# Patient Record
Sex: Female | Born: 1965 | Race: White | Hispanic: No | Marital: Single | Smoking: Former smoker
Health system: Southern US, Community
[De-identification: ages and names within clinical notes are randomized; demographics above are authoritative.]

## PROBLEM LIST (undated history)

## (undated) DIAGNOSIS — H9190 Unspecified hearing loss, unspecified ear: Secondary | ICD-10-CM

## (undated) DIAGNOSIS — F419 Anxiety disorder, unspecified: Secondary | ICD-10-CM

## (undated) DIAGNOSIS — R002 Palpitations: Secondary | ICD-10-CM

## (undated) DIAGNOSIS — L8991 Pressure ulcer of unspecified site, stage 1: Secondary | ICD-10-CM

## (undated) DIAGNOSIS — E875 Hyperkalemia: Secondary | ICD-10-CM

## (undated) DIAGNOSIS — Z973 Presence of spectacles and contact lenses: Secondary | ICD-10-CM

## (undated) DIAGNOSIS — G4700 Insomnia, unspecified: Secondary | ICD-10-CM

## (undated) DIAGNOSIS — R112 Nausea with vomiting, unspecified: Secondary | ICD-10-CM

## (undated) DIAGNOSIS — S82899A Other fracture of unspecified lower leg, initial encounter for closed fracture: Secondary | ICD-10-CM

## (undated) DIAGNOSIS — Z9889 Other specified postprocedural states: Secondary | ICD-10-CM

## (undated) DIAGNOSIS — R911 Solitary pulmonary nodule: Secondary | ICD-10-CM

## (undated) DIAGNOSIS — S62109A Fracture of unspecified carpal bone, unspecified wrist, initial encounter for closed fracture: Secondary | ICD-10-CM

## (undated) DIAGNOSIS — I700 Atherosclerosis of aorta: Secondary | ICD-10-CM

## (undated) DIAGNOSIS — K219 Gastro-esophageal reflux disease without esophagitis: Secondary | ICD-10-CM

## (undated) DIAGNOSIS — I10 Essential (primary) hypertension: Secondary | ICD-10-CM

---

## 1993-08-01 HISTORY — PX: MASTOIDECTOMY: SHX711

## 2002-09-29 ENCOUNTER — Encounter: Payer: Self-pay | Admitting: Emergency Medicine

## 2002-09-29 ENCOUNTER — Emergency Department (HOSPITAL_COMMUNITY): Admission: EM | Admit: 2002-09-29 | Discharge: 2002-09-29 | Payer: Self-pay | Admitting: Emergency Medicine

## 2002-11-11 ENCOUNTER — Ambulatory Visit (HOSPITAL_BASED_OUTPATIENT_CLINIC_OR_DEPARTMENT_OTHER): Admission: RE | Admit: 2002-11-11 | Discharge: 2002-11-11 | Payer: Self-pay | Admitting: Orthopedic Surgery

## 2003-08-02 HISTORY — PX: KNEE ARTHROSCOPY: SHX127

## 2006-01-23 ENCOUNTER — Ambulatory Visit (HOSPITAL_COMMUNITY): Admission: RE | Admit: 2006-01-23 | Discharge: 2006-01-23 | Payer: Self-pay | Admitting: Obstetrics and Gynecology

## 2009-01-15 ENCOUNTER — Emergency Department (HOSPITAL_COMMUNITY): Admission: EM | Admit: 2009-01-15 | Discharge: 2009-01-15 | Payer: Self-pay | Admitting: Emergency Medicine

## 2010-03-28 ENCOUNTER — Emergency Department (HOSPITAL_COMMUNITY): Admission: EM | Admit: 2010-03-28 | Discharge: 2010-03-28 | Payer: Self-pay | Admitting: Emergency Medicine

## 2010-08-22 ENCOUNTER — Encounter: Payer: Self-pay | Admitting: Obstetrics and Gynecology

## 2010-11-09 ENCOUNTER — Emergency Department (HOSPITAL_COMMUNITY)
Admission: EM | Admit: 2010-11-09 | Discharge: 2010-11-09 | Disposition: A | Discharge status: Home / Self Care | Payer: Self-pay | Attending: Emergency Medicine | Admitting: Emergency Medicine

## 2010-11-09 DIAGNOSIS — N390 Urinary tract infection, site not specified: Secondary | ICD-10-CM | POA: Insufficient documentation

## 2010-11-09 DIAGNOSIS — I10 Essential (primary) hypertension: Secondary | ICD-10-CM | POA: Insufficient documentation

## 2010-11-09 DIAGNOSIS — K219 Gastro-esophageal reflux disease without esophagitis: Secondary | ICD-10-CM | POA: Insufficient documentation

## 2010-11-09 LAB — URINALYSIS, ROUTINE W REFLEX MICROSCOPIC
Nitrite: POSITIVE — AB
Protein, ur: 100 mg/dL — AB
Specific Gravity, Urine: 1.019 (ref 1.005–1.030)
Urobilinogen, UA: 2 mg/dL — ABNORMAL HIGH (ref 0.0–1.0)

## 2010-11-09 LAB — URINE MICROSCOPIC-ADD ON

## 2010-11-09 LAB — PREGNANCY, URINE: Preg Test, Ur: NEGATIVE

## 2010-11-11 LAB — URINE CULTURE: Culture  Setup Time: 201204110105

## 2010-11-29 ENCOUNTER — Inpatient Hospital Stay (INDEPENDENT_AMBULATORY_CARE_PROVIDER_SITE_OTHER)
Admission: RE | Admit: 2010-11-29 | Discharge: 2010-11-29 | Disposition: A | Discharge status: Home / Self Care | Payer: Self-pay | Source: Ambulatory Visit | Attending: Family Medicine | Admitting: Family Medicine

## 2010-11-29 DIAGNOSIS — N390 Urinary tract infection, site not specified: Secondary | ICD-10-CM

## 2010-11-29 LAB — POCT URINALYSIS DIP (DEVICE)
Bilirubin Urine: NEGATIVE
Hgb urine dipstick: NEGATIVE
Nitrite: POSITIVE — AB
Protein, ur: NEGATIVE mg/dL
pH: 5 (ref 5.0–8.0)

## 2010-11-29 LAB — GLUCOSE, CAPILLARY

## 2010-11-30 LAB — URINE CULTURE
Colony Count: 25000
Culture  Setup Time: 201204301137

## 2010-12-17 NOTE — Op Note (Signed)
[name], [name]                       ACCOUNT NO.:  [id number]   MEDICAL RECORD NO.:  [id number]                   PATIENT TYPE:  AMB   LOCATION:  DSC                                  FACILITY:  [facility]   PHYSICIAN:  Feliberto Gottron. Turner Daniels, M.D.                DATE OF BIRTH:  04/10/1966   DATE OF PROCEDURE:  11/11/2002  DATE OF DISCHARGE:                                 OPERATIVE REPORT   PREOPERATIVE DIAGNOSIS:  Possible left knee medial meniscal tear with a  locked left knee versus anterior cruciate ligament tear versus chondral  lesion.   POSTOPERATIVE DIAGNOSIS:  Left knee two chondral loose bodies, one tied up  in the ligamentum mucosum, the other in the prepatellar fat pad, donor site  from the lateral facet of the patella.   PROCEDURE:  Arthroscopic removal of loose bodies and debridement of  chondromalacia of patella, grade 3.   SURGEON:  Feliberto Gottron. Turner Daniels, M.D.   FIRST ASSISTANT:  Erskine Squibb B. Jannet Mantis.   ANESTHETIC:  General LMA.   ESTIMATED BLOOD LOSS:  Minimal.   FLUID REPLACEMENT:  800 mL of crystalloid.   DRAINS PLACED:  None.   TOURNIQUET TIME:  None.   INDICATION FOR PROCEDURE:  [age]-year-old woman who sustained an injury  to her left knee at work many weeks ago.  She has presented to our office  with a locked knee.  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  In any event, because of the locked  knee, she presented to the emergency room, was placed in an immobilizer and  at the time we evaluated her on the 9th of March 2004, we could not get her  knee into full extension.  The plan was to proceed with arthroscopic  evaluation and treatment of the knee and because she is 36 and relatively  sedentary, if the ACL was torn, to not repair the ACL.   INDICATION FOR SURGERY:  Pain and loss of function.   DESCRIPTION OF PROCEDURE:  The patient was identified by arm band and taken  to the operating room at  [facility], appropriate anesthetic  monitors were attached and general LMA induced with the patient in the  supine position.  Lateral post was applied to the table and the left lower  extremity was prepped and draped in the usual sterile fashion from the ankle  to the mid-thigh.  Using an #11 blade, standard inferomedial and  inferolateral parapatellar portals were made, allowing introduction of the  arthroscope in through the inferolateral portal and the outflow through the  inferomedial portal.  We immediately identified some -- what appeared to be  -- old blood in the joint fluid and this of course was cleansed with the  arthroscope and the outflow lavage.  We identified chondromalacia of the  lateral facet of the patella  that was about 1 x 1 cm, grade 3, with some  flap tearing and this was debrided back to stable margins.  Moving into the  medial compartment, we identified that the meniscus was intact, as was the  articular cartilage of the trochlea and the medial and the lateral  compartments as well, as well as the lateral meniscus.  We did find some  chunks of cartilage, one tied up in the ligamentum mucosum which had to be  removed with an arthroscopic grasper as it was too big for the sucker  shaver, and another in the prepatellar fat pad, also requiring removal with  the arthroscopic graspers.  More likely than not, the donor site for these  chucks of cartilage, which were about 5 x 5 mm, was the lateral facet of the  patella.  The one in the ligamentum mucosum was in a position where it may  in fact have blocked motion or at least caused pain with extension.  The  gutters were cleared.  The scope was taken medial and lateral to the  posterior compartments as well and the knee was washed out with normal  saline solution.  The underwater electrocautery was used to control some  small bleeders and the arthroscopic instruments were removed.  We then  injected the 0.5%  Marcaine and epinephrine solution x10 mL into the portals  and a dressing of Xeroform, 4 x 4 dressing sponges, Webril and an ACE wrap  applied.  The patient was awakened and taken to the recovery room without  difficulty.                                               Feliberto Gottron. Turner Daniels, M.D.    Ovid Curd  D:  11/11/2002  T:  11/11/2002  Job:  [id number]

## 2011-01-19 ENCOUNTER — Emergency Department (HOSPITAL_COMMUNITY)
Admission: EM | Admit: 2011-01-19 | Discharge: 2011-01-19 | Disposition: A | Discharge status: Home / Self Care | Payer: Self-pay | Attending: Emergency Medicine | Admitting: Emergency Medicine

## 2011-01-19 DIAGNOSIS — R350 Frequency of micturition: Secondary | ICD-10-CM | POA: Insufficient documentation

## 2011-01-19 DIAGNOSIS — K219 Gastro-esophageal reflux disease without esophagitis: Secondary | ICD-10-CM | POA: Insufficient documentation

## 2011-01-19 DIAGNOSIS — R110 Nausea: Secondary | ICD-10-CM | POA: Insufficient documentation

## 2011-01-19 DIAGNOSIS — R3915 Urgency of urination: Secondary | ICD-10-CM | POA: Insufficient documentation

## 2011-01-19 DIAGNOSIS — R300 Dysuria: Secondary | ICD-10-CM | POA: Insufficient documentation

## 2011-01-19 DIAGNOSIS — I10 Essential (primary) hypertension: Secondary | ICD-10-CM | POA: Insufficient documentation

## 2011-01-19 DIAGNOSIS — N390 Urinary tract infection, site not specified: Secondary | ICD-10-CM | POA: Insufficient documentation

## 2011-01-19 DIAGNOSIS — Z79899 Other long term (current) drug therapy: Secondary | ICD-10-CM | POA: Insufficient documentation

## 2011-01-19 LAB — URINALYSIS, ROUTINE W REFLEX MICROSCOPIC
Glucose, UA: NEGATIVE mg/dL
Ketones, ur: 15 mg/dL — AB
Protein, ur: >300 mg/dL — AB

## 2011-01-19 LAB — PREGNANCY, URINE: Preg Test, Ur: NEGATIVE

## 2011-01-19 LAB — URINE MICROSCOPIC-ADD ON

## 2011-01-20 LAB — URINE CULTURE

## 2011-02-08 ENCOUNTER — Inpatient Hospital Stay (INDEPENDENT_AMBULATORY_CARE_PROVIDER_SITE_OTHER)
Admission: RE | Admit: 2011-02-08 | Discharge: 2011-02-08 | Disposition: A | Discharge status: Home / Self Care | Payer: Self-pay | Source: Ambulatory Visit | Attending: Emergency Medicine | Admitting: Emergency Medicine

## 2011-02-08 DIAGNOSIS — N390 Urinary tract infection, site not specified: Secondary | ICD-10-CM

## 2011-02-08 LAB — POCT PREGNANCY, URINE: Preg Test, Ur: NEGATIVE

## 2011-02-08 LAB — POCT URINALYSIS DIP (DEVICE)
Nitrite: POSITIVE — AB
pH: 5 (ref 5.0–8.0)

## 2011-02-08 LAB — URINALYSIS, MICROSCOPIC ONLY
Nitrite: POSITIVE — AB
Specific Gravity, Urine: 1.012 (ref 1.005–1.030)
Urobilinogen, UA: 1 mg/dL (ref 0.0–1.0)

## 2011-02-09 LAB — GLUCOSE, CAPILLARY

## 2011-02-09 LAB — URINE CULTURE: Culture  Setup Time: 201207102146

## 2013-04-08 ENCOUNTER — Other Ambulatory Visit (HOSPITAL_COMMUNITY): Payer: Self-pay | Admitting: Nurse Practitioner

## 2013-04-08 DIAGNOSIS — Z139 Encounter for screening, unspecified: Secondary | ICD-10-CM

## 2013-04-15 ENCOUNTER — Ambulatory Visit (HOSPITAL_COMMUNITY): Payer: Self-pay

## 2013-06-23 ENCOUNTER — Emergency Department (HOSPITAL_COMMUNITY)
Admission: EM | Admit: 2013-06-23 | Discharge: 2013-06-23 | Disposition: A | Discharge status: Home / Self Care | Payer: Self-pay | Attending: Emergency Medicine | Admitting: Emergency Medicine

## 2013-06-23 ENCOUNTER — Encounter (HOSPITAL_COMMUNITY): Payer: Self-pay | Admitting: Emergency Medicine

## 2013-06-23 DIAGNOSIS — J029 Acute pharyngitis, unspecified: Secondary | ICD-10-CM | POA: Insufficient documentation

## 2013-06-23 DIAGNOSIS — I10 Essential (primary) hypertension: Secondary | ICD-10-CM | POA: Insufficient documentation

## 2013-06-23 DIAGNOSIS — J209 Acute bronchitis, unspecified: Secondary | ICD-10-CM | POA: Insufficient documentation

## 2013-06-23 DIAGNOSIS — J40 Bronchitis, not specified as acute or chronic: Secondary | ICD-10-CM

## 2013-06-23 HISTORY — DX: Essential (primary) hypertension: I10

## 2013-06-23 MED ORDER — AMOXICILLIN 250 MG PO CAPS: 250.0000 mg | ORAL_CAPSULE | Freq: Three times a day (TID) | ORAL | Status: DC

## 2013-06-23 NOTE — ED Notes (Signed)
Pt c/o sore throat and cold sx. Pt son recently dx with strep throat.

## 2013-06-23 NOTE — ED Provider Notes (Signed)
Medical screening examination/treatment/procedure(s) were performed by non-physician practitioner and as supervising physician I was immediately available for consultation/collaboration.  EKG Interpretation   None        Donnetta Hutching, MD 06/23/13 1223

## 2013-06-23 NOTE — ED Provider Notes (Signed)
CSN: [id number]     Arrival date & time 06/23/13  1130 History   First MD Initiated Contact with Patient 06/23/13 1155     Chief Complaint  Patient presents with  . Sore Throat   (Consider location/radiation/quality/duration/timing/severity/associated sxs/prior Treatment) Patient is a 47 y.o. female presenting with pharyngitis. The history is provided by the patient.  Sore Throat This is a new problem. The current episode started in the past 7 days. The problem has been gradually worsening. Associated symptoms include chills, coughing, a fever, a sore throat and swollen glands. Pertinent negatives include no abdominal pain, nausea, neck pain, rash or vomiting. The symptoms are aggravated by eating. Treatments tried: OTC medications. The treatment provided mild relief.   [name] is a 47 y.o. female who presents to the ED with a sore throat that started 3 days ago. Her son was diagnosed with strep throat recently. She also has a cough and nasal congestion.  Past Medical History  Diagnosis Date  . Hypertension    History reviewed. No pertinent past surgical history. No family history on file. History  Substance Use Topics  . Smoking status: Never Smoker   . Smokeless tobacco: Not on file  . Alcohol Use: No   OB History   Grav Para Term Preterm Abortions TAB SAB Ect Mult Living                 Review of Systems  Constitutional: Positive for fever and chills.  HENT: Positive for sinus pressure and sore throat. Negative for ear pain, trouble swallowing and voice change.   Eyes: Negative for visual disturbance.  Respiratory: Positive for cough. Negative for shortness of breath.   Gastrointestinal: Negative for nausea, vomiting and abdominal pain.  Musculoskeletal: Negative for neck pain and neck stiffness.  Skin: Negative for rash.  Neurological: Negative for dizziness.  Psychiatric/Behavioral: The patient is not nervous/anxious.     Allergies  Ciprofloxacin; Nitrofuran  derivatives; and Sulfa antibiotics  Home Medications  No current outpatient prescriptions on file. BP 125/51  Pulse 73  Temp(Src) 98.7 F (37.1 C) (Oral)  Resp 18  Ht 5\' 3"  (1.6 m)  Wt 240 lb (108.863 kg)  BMI 42.52 kg/m2  SpO2 100% Physical Exam  Nursing note and vitals reviewed. Constitutional: She is oriented to person, place, and time. She appears well-developed and well-nourished. No distress.  HENT:  Head: Normocephalic and atraumatic.  Right Ear: Tympanic membrane normal.  Left Ear: Tympanic membrane normal.  Mouth/Throat: Uvula is midline and mucous membranes are normal. Posterior oropharyngeal erythema present.  Tonsils enlarged  Eyes: EOM are normal.  Neck: Normal range of motion. Neck supple.  Cardiovascular: Normal rate, regular rhythm and normal heart sounds.   Pulmonary/Chest: Effort normal and breath sounds normal.  Musculoskeletal: Normal range of motion.  Lymphadenopathy:    She has cervical adenopathy.  Neurological: She is alert and oriented to person, place, and time. No cranial nerve deficit.  Skin: Skin is warm and dry.  Psychiatric: She has a normal mood and affect. Her behavior is normal.    ED Course  Procedures  EKG Interpretation   None      MDM  48 y.o. female with sore throat, fever off and on for 3 days and cough. Since her son was diagnoses with with strep throat will treat with antibiotics. She will continue to take her cough medication as needed and tylenol or ibuprofen as needed. She will return if symptoms worsen.  Discussed with the patient  and all questioned fully answered.    Medication List         amoxicillin 250 MG capsule  Commonly known as:  AMOXIL  Take 1 capsule (250 mg total) by mouth 3 (three) times daily.         Janne Napoleon, [place] 06/23/13 1209

## 2013-09-02 ENCOUNTER — Encounter (HOSPITAL_COMMUNITY): Payer: Self-pay | Admitting: Emergency Medicine

## 2013-09-02 ENCOUNTER — Emergency Department (HOSPITAL_COMMUNITY)
Admission: EM | Admit: 2013-09-02 | Discharge: 2013-09-02 | Disposition: A | Discharge status: Home / Self Care | Payer: Self-pay | Attending: Emergency Medicine | Admitting: Emergency Medicine

## 2013-09-02 DIAGNOSIS — J3489 Other specified disorders of nose and nasal sinuses: Secondary | ICD-10-CM | POA: Insufficient documentation

## 2013-09-02 DIAGNOSIS — H9210 Otorrhea, unspecified ear: Secondary | ICD-10-CM | POA: Insufficient documentation

## 2013-09-02 DIAGNOSIS — H9209 Otalgia, unspecified ear: Secondary | ICD-10-CM | POA: Insufficient documentation

## 2013-09-02 DIAGNOSIS — R059 Cough, unspecified: Secondary | ICD-10-CM | POA: Insufficient documentation

## 2013-09-02 DIAGNOSIS — R509 Fever, unspecified: Secondary | ICD-10-CM | POA: Insufficient documentation

## 2013-09-02 DIAGNOSIS — Z87891 Personal history of nicotine dependence: Secondary | ICD-10-CM | POA: Insufficient documentation

## 2013-09-02 DIAGNOSIS — H9201 Otalgia, right ear: Secondary | ICD-10-CM

## 2013-09-02 DIAGNOSIS — I10 Essential (primary) hypertension: Secondary | ICD-10-CM | POA: Insufficient documentation

## 2013-09-02 DIAGNOSIS — Z880 Allergy status to penicillin: Secondary | ICD-10-CM | POA: Insufficient documentation

## 2013-09-02 DIAGNOSIS — R05 Cough: Secondary | ICD-10-CM | POA: Insufficient documentation

## 2013-09-02 DIAGNOSIS — R51 Headache: Secondary | ICD-10-CM | POA: Insufficient documentation

## 2013-09-02 MED ORDER — CEPHALEXIN 500 MG PO CAPS: 500.0000 mg | ORAL_CAPSULE | Freq: Four times a day (QID) | ORAL | Status: DC

## 2013-09-02 NOTE — Discharge Instructions (Signed)
Otitis Media, Adult Otitis media is redness, soreness, and swelling (inflammation) of the middle ear. Otitis media may be caused by allergies or, most commonly, by infection. Often it occurs as a complication of the common cold. SIGNS AND SYMPTOMS Symptoms of otitis media may include:  Earache.  Fever.  Ringing in your ear.  Headache.  Leakage of fluid from the ear. DIAGNOSIS To diagnose otitis media, your health care provider will examine your ear with an otoscope. This is an instrument that allows your health care provider to see into your ear in order to examine your eardrum. Your health care provider also will ask you questions about your symptoms. TREATMENT  Typically, otitis media resolves on its own within 3 5 days. Your health care provider may prescribe medicine to ease your symptoms of pain. If otitis media does not resolve within 5 days or is recurrent, your health care provider may prescribe antibiotic medicines if he or she suspects that a bacterial infection is the cause. HOME CARE INSTRUCTIONS   Take your medicine as directed until it is gone, even if you feel better after the first few days.  Only take over-the-counter or prescription medicines for pain, discomfort, or fever as directed by your health care provider.  Follow up with your health care provider as directed. SEEK MEDICAL CARE IF:  You have otitis media only in one ear or bleeding from your nose or both.  You notice a lump on your neck.  You are not getting better in 3 5 days.  You feel worse instead of better. SEEK IMMEDIATE MEDICAL CARE IF:   You have pain that is not controlled with medicine.  You have swelling, redness, or pain around your ear or stiffness in your neck.  You notice that part of your face is paralyzed.  You notice that the bone behind your ear (mastoid) is tender when you touch it. MAKE SURE YOU:   Understand these instructions.  Will watch your condition.  Will get help  right away if you are not doing well or get worse. Document Released: 04/22/2004 Document Revised: 05/08/2013 Document Reviewed: 02/12/2013 [facility] Patient Information 2014 [employer] [place].  Take antibiotic as prescribed

## 2013-09-02 NOTE — ED Notes (Signed)
Rt ear pain, alert,  Already seen by Np and ready for d/c

## 2013-09-02 NOTE — ED Provider Notes (Signed)
CSN: [id number]     Arrival date & time 09/02/13  1426 History  This chart was scribed for non-physician practitioner Irish Elders, NP working with Glynn Octave, MD by Valera Castle, ED scribe. This patient was seen in room APFT23/APFT23 and the patient's care was started at 2:59 PM.   Chief Complaint  Patient presents with  . Otalgia    The history is provided by the patient. No language interpreter was used.   HPI Comments: [name] is a 48 y.o. female with h/o recurrent right ear infections, who presents to the Emergency Department complaining of constant, throbbing, right ear pain, with associated yellow drainage, onset this morning. She reports associated intermittent fever, sinus pressure, intermittent, dry cough, and headache, onset 1 week ago. She reports taking Advil this morning for her fever. 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 She reports the most recent surgery was a tube placement to allow her ear to drain for recurring infections. She reports being seen for flu symptoms, had flu screen, which came back negative. She denies sore throat, wheezing, congestion, and any other associated symptoms. She denies h/o asthma. She reports being allergic to Cipro, Nitrofuran, and sulfa antibiotics. She reports having success with Keflex.   PCP - No primary provider on file.  Past Medical History  Diagnosis Date  . Hypertension    Past Surgical History  Procedure Laterality Date  . 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 Cesarean section    . Knee arthroscopy Left    Family History  Problem Relation Age of Onset  . Cancer Other   . Heart disease Other    History  Substance Use Topics  . Smoking status: Former Smoker -- 1.00 packs/day for 32 years    Types: Cigarettes    Quit date: 01/29/2013  . Smokeless tobacco: Never Used  . Alcohol Use: No   OB History   Grav  Para Term Preterm Abortions TAB SAB Ect Mult Living   2 2 2       2      Review of Systems  Constitutional: Positive for fever.  HENT: Positive for ear discharge (yellow), ear pain (right) and sinus pressure. Negative for congestion and sore throat.   Respiratory: Positive for cough. Negative for wheezing.   Neurological: Positive for headaches.  All other systems reviewed and are negative.    Allergies  Ciprofloxacin; Nitrofuran derivatives; and Sulfa antibiotics  Home Medications   Current Outpatient Rx  Name  Route  Sig  Dispense  Refill  . amoxicillin (AMOXIL) 250 MG capsule   Oral   Take 1 capsule (250 mg total) by mouth 3 (three) times daily.   30 capsule   0    BP 149/77  Pulse 66  Temp(Src) 98 F (36.7 C) (Oral)  Resp 18  Ht 5\' 3"  (1.6 m)  Wt 240 lb (108.863 kg)  BMI 42.52 kg/m2  SpO2 100%  Physical Exam  Nursing note and vitals reviewed. Constitutional: She is oriented to person, place, and time. She appears well-developed and well-nourished. No distress.  HENT:  Head: Normocephalic and atraumatic.  Left Ear: Tympanic membrane, external ear and ear canal normal.  Mouth/Throat: Uvula is midline, oropharynx is clear and moist and mucous membranes are normal.  Eyes: EOM are normal.  Neck: Neck supple.  Cardiovascular: Normal rate, regular rhythm and normal heart sounds.   No  murmur heard. Pulmonary/Chest: Effort normal and breath sounds normal. No respiratory distress. She has no wheezes. She has no rales.  Musculoskeletal: Normal range of motion.  Neurological: She is alert and oriented to person, place, and time.  Skin: Skin is warm and dry.  Psychiatric: She has a normal mood and affect. Her behavior is normal.    ED Course  Procedures (including critical care time)  DIAGNOSTIC STUDIES: Oxygen Saturation is 100% on room air, normal by my interpretation.    COORDINATION OF CARE: 3:07 PM-Discussed treatment plan which includes Keflex with pt at  bedside and pt agreed to plan.   Medications - No data to display  MDM   1. Otalgia of right ear    Afebrile at today's visit. Ear pain with history of sinus congestion and fever. No cough, shortness of breath or any reported difficulty breathing. Pt has had surgeries in her right ear and has a history of ear infections. Discussed plan of care with pt and she agrees. Oral hydration, rest and prescription for cephalexin given.   I personally performed the services described in this documentation, which was scribed in my presence. The recorded information has been reviewed and is accurate.    Irish EldersKelly Andora Krull, NP 09/15/13 1555

## 2013-09-02 NOTE — ED Notes (Signed)
Patient c/o right ear ache with fever, sinus pressure, and headache. Patient reports ear ache started this morning but the fever, sinus pressure, and headache started about 1 week ago. Patient reports being checked for flu, which was negative. Per patient small amount of yellow drainage. Patient also reports multiple surgries on right ear. Patient reports taking advil this morning at 10:30 for fever.

## 2013-09-16 NOTE — ED Provider Notes (Signed)
Medical screening examination/treatment/procedure(s) were performed by non-physician practitioner and as supervising physician I was immediately available for consultation/collaboration.  EKG Interpretation   None         Glynn OctaveStephen Sanchez Hemmer, MD 09/16/13 [phone]

## 2013-11-03 ENCOUNTER — Encounter (HOSPITAL_COMMUNITY): Payer: Self-pay | Admitting: Emergency Medicine

## 2013-11-03 ENCOUNTER — Emergency Department (HOSPITAL_COMMUNITY)
Admission: EM | Admit: 2013-11-03 | Discharge: 2013-11-03 | Disposition: A | Discharge status: Home / Self Care | Payer: Self-pay | Attending: Emergency Medicine | Admitting: Emergency Medicine

## 2013-11-03 DIAGNOSIS — Z79899 Other long term (current) drug therapy: Secondary | ICD-10-CM | POA: Insufficient documentation

## 2013-11-03 DIAGNOSIS — Z0289 Encounter for other administrative examinations: Secondary | ICD-10-CM | POA: Insufficient documentation

## 2013-11-03 DIAGNOSIS — I10 Essential (primary) hypertension: Secondary | ICD-10-CM | POA: Insufficient documentation

## 2013-11-03 DIAGNOSIS — Z87891 Personal history of nicotine dependence: Secondary | ICD-10-CM | POA: Insufficient documentation

## 2013-11-03 DIAGNOSIS — R5383 Other fatigue: Secondary | ICD-10-CM

## 2013-11-03 DIAGNOSIS — K5289 Other specified noninfective gastroenteritis and colitis: Secondary | ICD-10-CM | POA: Insufficient documentation

## 2013-11-03 DIAGNOSIS — R5381 Other malaise: Secondary | ICD-10-CM | POA: Insufficient documentation

## 2013-11-03 DIAGNOSIS — K529 Noninfective gastroenteritis and colitis, unspecified: Secondary | ICD-10-CM

## 2013-11-03 NOTE — ED Provider Notes (Signed)
CSN: [id number]     Arrival date & time 11/03/13  1339 History   First MD Initiated Contact with Patient 11/03/13 1505    This chart was scribed for Jaime Gaskinsonald W Kemp Gomes, MD by Marica OtterNusrat Rahman, ED Scribe. This patient was seen in room APA19/APA19 and the patient's care was started at 3:07 PM.  PCP: Olen CordialBISSETTE,STEVEN, MD  Chief Complaint  Patient presents with  . Emesis  . needs work note    HPI HPI Comments: [name] is a 48 y.o. female who presents to the Emergency Department complaining of diarrhea onset approximately 4:30pm yesterday afternoon. Pt reports she also had vomiting since yesterday evening which has resolved. Pt denies abd pain, however, reports that her abd feels sore. Pt reports the main reason for her visit to the ED today is to get a note from the ED excusing her from work.   Sick Contact: Granddaughter and son both have similar symptoms.  Past Medical History  Diagnosis Date  . Hypertension    Past Surgical History  Procedure Laterality Date  . 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 Cesarean section    . Knee arthroscopy Left    Family History  Problem Relation Age of Onset  . Cancer Other   . Heart disease Other    History  Substance Use Topics  . Smoking status: Former Smoker -- 1.00 packs/day for 32 years    Types: Cigarettes    Quit date: 01/29/2013  . Smokeless tobacco: Never Used  . Alcohol Use: No   OB History   Grav Para Term Preterm Abortions TAB SAB Ect Mult Living   2 2 2       2      Review of Systems  Constitutional: Positive for fatigue.  Gastrointestinal: Positive for nausea, vomiting, abdominal pain and diarrhea.      Allergies  Ciprofloxacin; Nitrofuran derivatives; and Sulfa antibiotics  Home Medications   Current Outpatient Rx  Name  Route  Sig  Dispense  Refill  . atenolol (TENORMIN) 25 MG tablet   Oral   Take 25 mg by mouth daily.         . Multiple Vitamin (MULTIVITAMIN WITH MINERALS) TABS  tablet   Oral   Take 1 tablet by mouth daily.          Triage Vitals: BP 163/94  Pulse 64  Temp(Src) 98.2 F (36.8 C) (Oral)  Resp 20  Ht 5\' 3"  (1.6 m)  Wt 230 lb (104.327 kg)  BMI 40.75 kg/m2  SpO2 98% Physical Exam CONSTITUTIONAL: Well developed/well nourished HEAD: Normocephalic/atraumatic EYES: EOMI/PERRL. No icterus ENMT: Mucous membranes dry NECK: supple no meningeal signs SPINE:entire spine nontender CV: S1/S2 noted, no murmurs/rubs/gallops noted LUNGS: Lungs are clear to auscultation bilaterally, no apparent distress ABDOMEN: soft, nontender, no rebound or guarding GU:no cva tenderness NEURO: Pt is awake/alert, moves all extremitiesx4 EXTREMITIES: pulses normal, full ROM SKIN: warm, color normal PSYCH: no abnormalities of mood noted  ED Course  Procedures  DIAGNOSTIC STUDIES: Oxygen Saturation is 98% on RA, normal by my interpretation.    COORDINATION OF CARE:  3:09PM-Discussed treatment plan which includes advising pt that she should return to the ED if symptoms worsen and pt agreed to plan. Pt also offered meds for nausea, however, pt refused meds stating that she is not presently experiencing nausea.     MDM   Final diagnoses:  Gastroenteritis    Nursing notes including past medical history and social history reviewed  and considered in documentation   I personally performed the services described in this documentation, which was scribed in my presence. The recorded information has been reviewed and is accurate.      Jaime Gaskins, MD 11/03/13 [phone]

## 2013-11-03 NOTE — ED Notes (Signed)
Pt reports vomiting and diarrhea since 4:30 yesterday afternoon.  Hasn't vomited since 0530 this morning but still has diarrhea.  Pt says her employer is requiring a work note because of the Easter holiday.  Pt says feels weak and ribs hurt from vomiting.

## 2013-12-15 ENCOUNTER — Encounter (HOSPITAL_COMMUNITY): Payer: Self-pay | Admitting: Emergency Medicine

## 2013-12-15 ENCOUNTER — Emergency Department (HOSPITAL_COMMUNITY)
Admission: EM | Admit: 2013-12-15 | Discharge: 2013-12-15 | Disposition: A | Discharge status: Home / Self Care | Payer: Self-pay | Attending: Emergency Medicine | Admitting: Emergency Medicine

## 2013-12-15 DIAGNOSIS — R221 Localized swelling, mass and lump, neck: Secondary | ICD-10-CM

## 2013-12-15 DIAGNOSIS — Z79899 Other long term (current) drug therapy: Secondary | ICD-10-CM | POA: Insufficient documentation

## 2013-12-15 DIAGNOSIS — R220 Localized swelling, mass and lump, head: Secondary | ICD-10-CM | POA: Insufficient documentation

## 2013-12-15 DIAGNOSIS — Z87891 Personal history of nicotine dependence: Secondary | ICD-10-CM | POA: Insufficient documentation

## 2013-12-15 DIAGNOSIS — I10 Essential (primary) hypertension: Secondary | ICD-10-CM | POA: Insufficient documentation

## 2013-12-15 DIAGNOSIS — K029 Dental caries, unspecified: Secondary | ICD-10-CM | POA: Insufficient documentation

## 2013-12-15 MED ORDER — AMOXICILLIN 500 MG PO CAPS: 500.0000 mg | ORAL_CAPSULE | Freq: Three times a day (TID) | ORAL | Status: DC

## 2013-12-15 MED ORDER — ACETAMINOPHEN-CODEINE #3 300-30 MG PO TABS: 1.0000 | ORAL_TABLET | Freq: Four times a day (QID) | ORAL | Status: DC | PRN

## 2013-12-15 NOTE — ED Notes (Signed)
Swelling right side of face with pain.

## 2013-12-15 NOTE — ED Provider Notes (Signed)
Medical screening examination/treatment/procedure(s) were performed by non-physician practitioner and as supervising physician I was immediately available for consultation/collaboration.   EKG Interpretation None       Delcia Spitzley, MD 12/15/13 1513 

## 2013-12-15 NOTE — ED Provider Notes (Signed)
CSN: [id number]     Arrival date & time 12/15/13  1113 History  This chart was scribed for Jaime QualeHobson [name], PA-C, working with Donnetta HutchingBrian Cook, MD, by [facility] Scribe. This patient was seen in room APFT22/APFT22 and the patient's care was started at 12:51 PM.  Chief Complaint  Patient presents with  . Dental Pain    The history is provided by the patient. No language interpreter was used.    HPI Comments: Jaime BurdockSheri C Smith is a 48 y.o. Female with a history of HTN and no other chronic medical conditions who presents to the Emergency Department complaining of constant right lower dental pain onset yesterday. She states that her dental pain began as mild "soreness" yesterday, but that she awoke from sleep at 2:00 AM this morning with increased pain and associated swelling to the right side of her face. She states that she has not been able to sleep due to pain. She suspects that she might have a dental abscess. She is unsure if she has had any associated fever, as she has not checked her temperature. Her temperature in triage was taken to be 98.1 F. She denies any other pain or symptoms. Pt is a former smoker of 1 ppd for 32 years, who quit about 1 year ago. Pt states that she has antibiotic allergies to Cipro and Sulfa antibiotics. Pt works as a Engineer, civil (consulting)nurse, and states a preference for a low-dose of Tylenol 3 and Keflex if she is to be given medications.   Past Medical History  Diagnosis Date  . Hypertension    Past Surgical History  Procedure Laterality Date  . 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 Cesarean section    . Knee arthroscopy Left    Family History  Problem Relation Age of Onset  . Cancer Other   . Heart disease Other    History  Substance Use Topics  . Smoking status: Former Smoker -- 1.00 packs/day for 32 years    Types: Cigarettes    Quit date: 01/29/2013  . Smokeless tobacco: Never Used  . Alcohol Use: No   OB History   Grav Para Term Preterm Abortions  TAB SAB Ect Mult Living   2 2 2       2      Review of Systems  HENT: Positive for dental problem and facial swelling.   All other systems reviewed and are negative.  Allergies  Ciprofloxacin; Nitrofuran derivatives; and Sulfa antibiotics  Home Medications   Prior to Admission medications   Medication Sig Start Date End Date Taking? Authorizing Provider  atenolol (TENORMIN) 25 MG tablet Take 25 mg by mouth daily.    Historical Provider, MD  Multiple Vitamin (MULTIVITAMIN WITH MINERALS) TABS tablet Take 1 tablet by mouth daily.    Historical Provider, MD   Triage Vitals: BP 127/80  Pulse 73  Temp(Src) 98.1 F (36.7 C) (Oral)  Resp 18  Ht 5\' 3"  (1.6 m)  Wt 230 lb (104.327 kg)  BMI 40.75 kg/m2  SpO2 97%  Physical Exam  Nursing note and vitals reviewed. Constitutional: She is oriented to person, place, and time. She appears well-developed and well-nourished. No distress.  HENT:  Head: Normocephalic and atraumatic.  Right lower premolar, 1st and 2nd molar with extensive decay. Significant swelling of the gum. No visible abscess. No swelling under the tongue. Airway is patent. No periauricular nodes palpated.   Eyes: EOM are normal. Pupils are equal, round, and reactive to light.  Neck: Neck supple. No tracheal deviation present.  No cervical lymphadenopathy.  Cardiovascular: Normal rate and regular rhythm.   No murmur heard. Pulmonary/Chest: Effort normal and breath sounds normal. No respiratory distress. She has no wheezes. She has no rales.  Lungs CTA. Symmetrical rise and fall of the chest.  Musculoskeletal: Normal range of motion.  Lymphadenopathy:    She has no cervical adenopathy.  Neurological: She is alert and oriented to person, place, and time.  Skin: Skin is warm and dry.  Psychiatric: She has a normal mood and affect. Her behavior is normal.    ED Course  Procedures (including critical care time)  DIAGNOSTIC STUDIES: Oxygen Saturation is 97% on RA, normal by  my interpretation.    COORDINATION OF CARE: 12:57 PM- Discussed plan to discharge pt with prescriptions for antibiotics and pain medication. Pt advised of plan for treatment and pt agrees.  Labs Review Labs Reviewed - No data to display  Imaging Review No results found.   EKG Interpretation None      MDM The patient has multiple dental caries present of the right lower jaw. No visible abscess appreciated. No temperature elevation. No cardiac murmurs appreciated. No evidence for Ludwig's Angina.  The plan at this time is for the patient to see a dentist as soon as possible. A prescription for Amoxil and Tylenol codeine given to the patient.    Final diagnoses:  Dental caries    **I have reviewed nursing notes, vital signs, and all appropriate lab and imaging results for this patient.*  **I personally performed the services described in this documentation, which was scribed in my presence. The recorded information has been reviewed and is accurate.Kathie Dike*  Dicy Smigel M Arianni Gallego, PA-C 12/15/13 1306

## 2013-12-15 NOTE — Discharge Instructions (Signed)
Toothache  Toothaches are usually caused by tooth decay (cavity). However, other causes of toothache include:  · Gum disease.  · Cracked tooth.  · Cracked filling.  · Injury.  · Jaw problem (temporo mandibular joint or TMJ disorder).  · Tooth abscess.  · Root sensitivity.  · Grinding.  · Eruption problems.  Swelling and redness around a painful tooth often means you have a dental abscess.  Pain medicine and antibiotics can help reduce symptoms, but you will need to see a dentist within the next few days to have your problem properly evaluated and treated. If tooth decay is the problem, you may need a filling or root canal to save your tooth. If the problem is more severe, your tooth may need to be pulled.  SEEK IMMEDIATE MEDICAL CARE IF:  · You cannot swallow.  · You develop severe swelling, increased redness, or increased pain in your mouth or face.  · You have a fever.  · You cannot open your mouth adequately.  Document Released: 08/25/2004 Document Revised: 10/10/2011 Document Reviewed: 10/15/2009  ExitCare® Patient Information ©2014 [employer].

## 2014-04-29 ENCOUNTER — Encounter (HOSPITAL_COMMUNITY): Payer: Self-pay | Admitting: Emergency Medicine

## 2014-04-29 ENCOUNTER — Emergency Department (HOSPITAL_COMMUNITY)
Admission: EM | Admit: 2014-04-29 | Discharge: 2014-04-29 | Disposition: A | Discharge status: Home / Self Care | Payer: Self-pay | Attending: Emergency Medicine | Admitting: Emergency Medicine

## 2014-04-29 DIAGNOSIS — J019 Acute sinusitis, unspecified: Secondary | ICD-10-CM | POA: Insufficient documentation

## 2014-04-29 DIAGNOSIS — R05 Cough: Secondary | ICD-10-CM | POA: Insufficient documentation

## 2014-04-29 DIAGNOSIS — I10 Essential (primary) hypertension: Secondary | ICD-10-CM | POA: Insufficient documentation

## 2014-04-29 DIAGNOSIS — Z87891 Personal history of nicotine dependence: Secondary | ICD-10-CM | POA: Insufficient documentation

## 2014-04-29 DIAGNOSIS — R059 Cough, unspecified: Secondary | ICD-10-CM | POA: Insufficient documentation

## 2014-04-29 DIAGNOSIS — Z792 Long term (current) use of antibiotics: Secondary | ICD-10-CM | POA: Insufficient documentation

## 2014-04-29 DIAGNOSIS — Z79899 Other long term (current) drug therapy: Secondary | ICD-10-CM | POA: Insufficient documentation

## 2014-04-29 DIAGNOSIS — J0101 Acute recurrent maxillary sinusitis: Secondary | ICD-10-CM

## 2014-04-29 MED ORDER — AMOXICILLIN 500 MG PO CAPS: 500.0000 mg | ORAL_CAPSULE | Freq: Three times a day (TID) | ORAL | Status: AC

## 2014-04-29 NOTE — ED Notes (Signed)
PT c/o sinus congestion with green drainage and right ear pain x1 week. PT denies any sob or chest congestion.

## 2014-04-29 NOTE — ED Notes (Signed)
Patient given discharge instruction, verbalized understand. Patient ambulatory out of the department.  

## 2014-04-29 NOTE — Discharge Instructions (Signed)

## 2014-04-29 NOTE — ED Notes (Signed)
Jaime FanningJulie at the bedside

## 2014-04-30 NOTE — ED Provider Notes (Signed)
Medical screening examination/treatment/procedure(s) were performed by non-physician practitioner and as supervising physician I was immediately available for consultation/collaboration.   EKG Interpretation None        Joya Gaskinsonald W Zema Lizardo, MD 04/30/14 [phone]

## 2014-04-30 NOTE — ED Provider Notes (Signed)
CSN: [id number]     Arrival date & time 04/29/14  1341 History   First MD Initiated Contact with Patient 04/29/14 1400     Chief Complaint  Patient presents with  . Recurrent Sinusitis     (Consider location/radiation/quality/duration/timing/severity/associated sxs/prior Treatment) The history is provided by the patient.   [name] is a 48 y.o. female presenting with a  1 week history of sinus congestion, draining green with blood tinged discharge, bilateral cheek and upper teeth constant aching pressure, increasing post nasal drip with cough.  She has had subjective fever which she has been treating with ibuprofen.  She denies dizziness, neck pain or stiffness, visual changes, facial swelling, sob or chest pain.  She reports having chronic allergy symptoms worsened spring and fall, currently battling ragweed using claritin.  She has found no alleviators.     Past Medical History  Diagnosis Date  . Hypertension    Past Surgical History  Procedure Laterality Date  . 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 Cesarean section    . Knee arthroscopy Left    Family History  Problem Relation Age of Onset  . Cancer Other   . Heart disease Other    History  Substance Use Topics  . Smoking status: Former Smoker -- 32 years    Types: Cigarettes    Quit date: 01/29/2013  . Smokeless tobacco: Never Used  . Alcohol Use: No   OB History   Grav Para Term Preterm Abortions TAB SAB Ect Mult Living   2 2 2       2      Review of Systems  Constitutional: Positive for fever and chills.  HENT: Positive for congestion, ear pain, postnasal drip, rhinorrhea and sinus pressure. Negative for facial swelling, sore throat, trouble swallowing and voice change.   Eyes: Negative for discharge.  Respiratory: Positive for cough. Negative for shortness of breath, wheezing and stridor.   Cardiovascular: Negative for chest pain.  Gastrointestinal: Negative for nausea and abdominal  pain.  Genitourinary: Negative.   Musculoskeletal: Negative for neck pain.      Allergies  Ciprofloxacin; Nitrofuran derivatives; and Sulfa antibiotics  Home Medications   Prior to Admission medications   Medication Sig Start Date End Date Taking? Authorizing Provider  atenolol (TENORMIN) 25 MG tablet Take 25 mg by mouth daily.   Yes Historical Provider, MD  CALCIUM PO Take 1 tablet by mouth daily.   Yes Historical Provider, MD  ibuprofen (ADVIL,MOTRIN) 200 MG tablet Take 800 mg by mouth 2 (two) times daily as needed for moderate pain.   Yes Historical Provider, MD  loratadine (CLARITIN) 10 MG tablet Take 10 mg by mouth daily as needed for allergies.    Yes Historical Provider, MD  omeprazole (PRILOSEC) 20 MG capsule Take 20 mg by mouth daily.   Yes Historical Provider, MD  amoxicillin (AMOXIL) 500 MG capsule Take 1 capsule (500 mg total) by mouth 3 (three) times daily. 04/29/14 05/09/14  Burgess Amor, PA-C   BP 153/83  Pulse 72  Temp(Src) 98.1 F (36.7 C) (Oral)  Resp 20  Ht 5\' 3"  (1.6 m)  Wt 240 lb (108.863 kg)  BMI 42.52 kg/m2  SpO2 100%  LMP 04/25/2014 Physical Exam  Constitutional: She is oriented to person, place, and time. She appears well-developed and well-nourished.  HENT:  Head: Normocephalic and atraumatic.  Right Ear: Ear canal normal.  Left Ear: Tympanic membrane and ear canal normal.  Nose: Mucosal edema and  rhinorrhea present. Right sinus exhibits maxillary sinus tenderness. Left sinus exhibits maxillary sinus tenderness.  Mouth/Throat: Uvula is midline, oropharynx is clear and moist and mucous membranes are normal. Normal dentition. No oropharyngeal exudate, posterior oropharyngeal edema, posterior oropharyngeal erythema or tonsillar abscesses.  Right ear canal and TM with post surgical scarring.  Eyes: Conjunctivae are normal.  Cardiovascular: Normal rate and normal heart sounds.   Pulmonary/Chest: Effort normal. No respiratory distress. She has no wheezes. She  has no rales.  Abdominal: Soft. There is no tenderness.  Musculoskeletal: Normal range of motion.  Neurological: She is alert and oriented to person, place, and time.  Skin: Skin is warm and dry. No rash noted.  Psychiatric: She has a normal mood and affect.    ED Course  Procedures (including critical care time) Labs Review Labs Reviewed - No data to display  Imaging Review No results found.   EKG Interpretation None      MDM   Final diagnoses:  Acute recurrent maxillary sinusitis    Amoxil prescribed.  Pt unable to tolerate decongestants due to se of tachycardia.  Unable to afford steroid nasal sprays.  She is currently using saline nasal wash, encouraged to continue, moist steam tx,  Menthol drops, vicks, etc for nasal decongestant tx.  F/u with pcp prn if sx do not improve with tx.  The patient appears reasonably screened and/or stabilized for discharge and I doubt any other medical condition or other [facility] requiring further screening, evaluation, or treatment in the ED at this time prior to discharge.     Burgess AmorJulie Madalaine Portier, PA-C 04/30/14 1538

## 2014-06-02 ENCOUNTER — Encounter (HOSPITAL_COMMUNITY): Payer: Self-pay | Admitting: Emergency Medicine

## 2014-10-16 ENCOUNTER — Encounter (HOSPITAL_COMMUNITY): Payer: Self-pay | Admitting: *Deleted

## 2014-10-16 ENCOUNTER — Emergency Department (HOSPITAL_COMMUNITY)
Admission: EM | Admit: 2014-10-16 | Discharge: 2014-10-16 | Disposition: A | Discharge status: Home / Self Care | Payer: Self-pay | Attending: Emergency Medicine | Admitting: Emergency Medicine

## 2014-10-16 DIAGNOSIS — Z87891 Personal history of nicotine dependence: Secondary | ICD-10-CM | POA: Insufficient documentation

## 2014-10-16 DIAGNOSIS — K029 Dental caries, unspecified: Secondary | ICD-10-CM | POA: Insufficient documentation

## 2014-10-16 DIAGNOSIS — I10 Essential (primary) hypertension: Secondary | ICD-10-CM | POA: Insufficient documentation

## 2014-10-16 DIAGNOSIS — Z79899 Other long term (current) drug therapy: Secondary | ICD-10-CM | POA: Insufficient documentation

## 2014-10-16 DIAGNOSIS — K088 Other specified disorders of teeth and supporting structures: Secondary | ICD-10-CM | POA: Insufficient documentation

## 2014-10-16 MED ORDER — AMOXICILLIN 500 MG PO CAPS: 500.0000 mg | ORAL_CAPSULE | Freq: Three times a day (TID) | ORAL | Status: DC

## 2014-10-16 NOTE — Discharge Instructions (Signed)
Dental Pain  A tooth ache may be caused by cavities (tooth decay). Cavities expose the nerve of the tooth to air and hot or cold temperatures. It may come from an infection or abscess (also called a boil or furuncle) around your tooth. It is also often caused by dental caries (tooth decay). This causes the pain you are having.  DIAGNOSIS   Your caregiver can diagnose this problem by exam.  TREATMENT   · If caused by an infection, it may be treated with medications which kill germs (antibiotics) and pain medications as prescribed by your caregiver. Take medications as directed.  · Only take over-the-counter or prescription medicines for pain, discomfort, or fever as directed by your caregiver.  · Whether the tooth ache today is caused by infection or dental disease, you should see your dentist as soon as possible for further care.  SEEK MEDICAL CARE IF:  The exam and treatment you received today has been provided on an emergency basis only. This is not a substitute for complete medical or dental care. If your problem worsens or new problems (symptoms) appear, and you are unable to meet with your dentist, call or return to this location.  SEEK IMMEDIATE MEDICAL CARE IF:   · You have a fever.  · You develop redness and swelling of your face, jaw, or neck.  · You are unable to open your mouth.  · You have severe pain uncontrolled by pain medicine.  MAKE SURE YOU:   · Understand these instructions.  · Will watch your condition.  · Will get help right away if you are not doing well or get worse.  Document Released: 07/18/2005 Document Revised: 10/10/2011 Document Reviewed: 03/05/2008  ExitCare® Patient Information ©2015 [employer]. This information is not intended to replace advice given to you by your health care provider. Make sure you discuss any questions you have with your health care provider.    Dental Caries  Dental caries is tooth decay. This decay can cause a hole in teeth (cavity) that can get bigger and  deeper over time.  HOME CARE  · Brush and floss your teeth. Do this at least two times a day.  · Use a fluoride toothpaste.  · Use a mouth rinse if told by your dentist or doctor.  · Eat less sugary and starchy foods. Drink less sugary drinks.  · Avoid snacking often on sugary and starchy foods. Avoid sipping often on sugary drinks.  · Keep regular checkups and cleanings with your dentist.  · Use fluoride supplements if told by your dentist or doctor.  · Allow fluoride to be applied to teeth if told by your dentist or doctor.  Document Released: 04/26/2008 Document Revised: 12/02/2013 Document Reviewed: 07/20/2012  ExitCare® Patient Information ©2015 [employer]. This information is not intended to replace advice given to you by your health care provider. Make sure you discuss any questions you have with your health care provider.

## 2014-10-16 NOTE — ED Notes (Signed)
Pt states dental pain x 2 days, stating a dental abscess. NAD.

## 2014-10-16 NOTE — ED Provider Notes (Signed)
CSN: [id number]     Arrival date & time 10/16/14  1058 History   First MD Initiated Contact with Patient 10/16/14 1200     Chief Complaint  Patient presents with  . Dental Pain     (Consider location/radiation/quality/duration/timing/severity/associated sxs/prior Treatment) Patient is a 49 y.o. female presenting with tooth pain. The history is provided by the patient.  Dental Pain Location:  Upper Upper teeth location:  11/LU cuspid Quality:  Throbbing Severity:  Severe Onset quality:  Gradual Duration:  2 days Timing:  Constant Progression:  Worsening Chronicity:  Recurrent Context: dental caries   Relieved by:  Nothing Worsened by:  Cold food/drink Ineffective treatments:  Acetaminophen  [name] is a 49 y.o. female who presents to the ED with dental pain. The pain started 2 days ago in the upper left dental area. She has a hx of dental problems.  Past Medical History  Diagnosis Date  . Hypertension    Past Surgical History  Procedure Laterality Date  . 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 Cesarean section    . Knee arthroscopy Left    Family History  Problem Relation Age of Onset  . Cancer Other   . Heart disease Other    History  Substance Use Topics  . Smoking status: Former Smoker -- 32 years    Types: Cigarettes    Quit date: 01/29/2013  . Smokeless tobacco: Never Used  . Alcohol Use: No   OB History    Gravida Para Term Preterm AB TAB SAB Ectopic Multiple Living   2 2 2       2      Review of Systems Negative except as stated in HPI   Allergies  Ciprofloxacin; Nitrofuran derivatives; and Sulfa antibiotics  Home Medications   Prior to Admission medications   Medication Sig Start Date End Date Taking? Authorizing Provider  atenolol (TENORMIN) 25 MG tablet Take 25 mg by mouth daily.   Yes Historical Provider, MD  CALCIUM PO Take 1 tablet by mouth daily.   Yes Historical Provider, MD  ibuprofen (ADVIL,MOTRIN) 200 MG  tablet Take 800 mg by mouth 2 (two) times daily as needed for moderate pain.   Yes Historical Provider, MD  loratadine (CLARITIN) 10 MG tablet Take 10 mg by mouth daily as needed for allergies.    Yes Historical Provider, MD  omeprazole (PRILOSEC) 20 MG capsule Take 20 mg by mouth daily.   Yes Historical Provider, MD  amoxicillin (AMOXIL) 500 MG capsule Take 1 capsule (500 mg total) by mouth 3 (three) times daily. 10/16/14   Magdalene Tardiff Orlene OchM Desiraye Rolfson, NP   BP 131/80 mmHg  Pulse 62  Temp(Src) 98.5 F (36.9 C) (Oral)  Resp 14  Ht 5\' 3"  (1.6 m)  Wt 239 lb (108.41 kg)  BMI 42.35 kg/m2  SpO2 100%  LMP 10/14/2014 Physical Exam  Constitutional: She is oriented to person, place, and time. She appears well-developed and well-nourished.  HENT:  Mouth/Throat: Uvula is midline, oropharynx is clear and moist and mucous membranes are normal.    Eyes: EOM are normal.  Neck: Neck supple.  Cardiovascular: Normal rate.   Pulmonary/Chest: Effort normal.  Abdominal: Soft. There is no tenderness.  Musculoskeletal: Normal range of motion.  Neurological: She is alert and oriented to person, place, and time. No cranial nerve deficit.  Skin: Skin is warm and dry.  Psychiatric: She has a normal mood and affect. Her behavior is normal.  Nursing note and vitals  reviewed.   ED Course  Procedures  Antibiotics and patient to take ibuprofen for discomfort.  MDM  49 y.o. female with dental pain and dental caries and hx of same. Stable for d/c without facial cellulitis or abscess noted. Discussed with the patient clinical findings and plan of care and all questioned fully answered. She will follow up with a dentist as soon as possible. Patient voices understanding and agrees with plan.    Final diagnoses:  Pain due to dental caries       Janne Napoleon, NP 10/18/14 1610  Benjiman Core, MD 10/20/14 1600

## 2014-10-16 NOTE — ED Notes (Signed)
Dental pain lt upper  Tooth with localized swelling and redness present

## 2015-01-19 ENCOUNTER — Encounter (HOSPITAL_COMMUNITY): Payer: Self-pay | Admitting: *Deleted

## 2015-01-19 ENCOUNTER — Emergency Department (HOSPITAL_COMMUNITY)
Admission: EM | Admit: 2015-01-19 | Discharge: 2015-01-19 | Disposition: A | Discharge status: Home / Self Care | Payer: Self-pay | Attending: Emergency Medicine | Admitting: Emergency Medicine

## 2015-01-19 DIAGNOSIS — R109 Unspecified abdominal pain: Secondary | ICD-10-CM | POA: Insufficient documentation

## 2015-01-19 DIAGNOSIS — Z79899 Other long term (current) drug therapy: Secondary | ICD-10-CM | POA: Insufficient documentation

## 2015-01-19 DIAGNOSIS — Z87891 Personal history of nicotine dependence: Secondary | ICD-10-CM | POA: Insufficient documentation

## 2015-01-19 DIAGNOSIS — S39012A Strain of muscle, fascia and tendon of lower back, initial encounter: Secondary | ICD-10-CM | POA: Insufficient documentation

## 2015-01-19 DIAGNOSIS — R110 Nausea: Secondary | ICD-10-CM | POA: Insufficient documentation

## 2015-01-19 DIAGNOSIS — Y999 Unspecified external cause status: Secondary | ICD-10-CM | POA: Insufficient documentation

## 2015-01-19 DIAGNOSIS — R6883 Chills (without fever): Secondary | ICD-10-CM | POA: Insufficient documentation

## 2015-01-19 DIAGNOSIS — I10 Essential (primary) hypertension: Secondary | ICD-10-CM | POA: Insufficient documentation

## 2015-01-19 DIAGNOSIS — Y939 Activity, unspecified: Secondary | ICD-10-CM | POA: Insufficient documentation

## 2015-01-19 DIAGNOSIS — Y929 Unspecified place or not applicable: Secondary | ICD-10-CM | POA: Insufficient documentation

## 2015-01-19 DIAGNOSIS — X58XXXA Exposure to other specified factors, initial encounter: Secondary | ICD-10-CM | POA: Insufficient documentation

## 2015-01-19 LAB — URINALYSIS, ROUTINE W REFLEX MICROSCOPIC
BILIRUBIN URINE: NEGATIVE
GLUCOSE, UA: NEGATIVE mg/dL
Ketones, ur: NEGATIVE mg/dL
Leukocytes, UA: NEGATIVE
Nitrite: NEGATIVE
PROTEIN: NEGATIVE mg/dL
Specific Gravity, Urine: >1.03 — ABNORMAL HIGH (ref 1.005–1.030)
UROBILINOGEN UA: 0.2 mg/dL (ref 0.0–1.0)
pH: 5.5 (ref 5.0–8.0)

## 2015-01-19 LAB — URINE MICROSCOPIC-ADD ON

## 2015-01-19 MED ORDER — METHOCARBAMOL 500 MG PO TABS: 500.0000 mg | ORAL_TABLET | Freq: Three times a day (TID) | ORAL | Status: DC

## 2015-01-19 MED ORDER — CEPHALEXIN 500 MG PO CAPS: 500.0000 mg | ORAL_CAPSULE | Freq: Four times a day (QID) | ORAL | Status: DC

## 2015-01-19 NOTE — ED Notes (Signed)
Pain lt lower back , vague nausea, chills, took motrin pta.   Thinks she may have a UTI

## 2015-01-19 NOTE — ED Notes (Signed)
PT c/o lower left sided back and flank pain with some nausea and chills.

## 2015-01-19 NOTE — ED Provider Notes (Signed)
CSN: [id number]     Arrival date & time 01/19/15  1441 History   None   This chart was scribed for non-physician practitioner, Lonia Skinner. Joylene Grapes, working with Eber Hong, MD by Marica Otter, ED Scribe. This patient was seen in room APFT20/APFT20 and the patient's care was started at 3:34 PM.  Chief Complaint  Patient presents with  . Back Pain   The history is provided by the patient. No language interpreter was used.   PCP: Olen Cordial, MD HPI Comments: [name] is a 49 y.o. female, with PMH noted below, who presents to the Emergency Department complaining of atraumatic, sudden onset, intermittent, left sided flank pain with associated nausea and chills. Pt also notes there is a strong smell when she urinates. Pt reports taking motrin at home without relief. Pt denies dysuria, frequency, vaginal discharge, pain in LE, fever, or vomiting.   Past Medical History  Diagnosis Date  . Hypertension    Past Surgical History  Procedure Laterality Date  . 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 Cesarean section    . Knee arthroscopy Left    Family History  Problem Relation Age of Onset  . Cancer Other   . Heart disease Other    History  Substance Use Topics  . Smoking status: Former Smoker -- 32 years    Types: Cigarettes    Quit date: 01/29/2013  . Smokeless tobacco: Never Used  . Alcohol Use: No   OB History    Gravida Para Term Preterm AB TAB SAB Ectopic Multiple Living   2 2 2       2      Review of Systems  Constitutional: Positive for chills. Negative for fever.  Gastrointestinal: Positive for nausea. Negative for vomiting.  Genitourinary: Positive for flank pain (left sided ). Negative for dysuria, urgency, frequency and vaginal discharge.  Musculoskeletal: Negative for back pain.   Allergies  Ciprofloxacin; Nitrofuran derivatives; and Sulfa antibiotics  Home Medications   Prior to Admission medications   Medication Sig Start Date  End Date Taking? Authorizing Provider  atenolol (TENORMIN) 25 MG tablet Take 25 mg by mouth daily.   Yes Historical Provider, MD  CALCIUM PO Take 1 tablet by mouth daily.   Yes Historical Provider, MD  ibuprofen (ADVIL,MOTRIN) 200 MG tablet Take 800 mg by mouth 2 (two) times daily as needed for moderate pain.   Yes Historical Provider, MD  loratadine (CLARITIN) 10 MG tablet Take 10 mg by mouth daily as needed for allergies.    Yes Historical Provider, MD  omeprazole (PRILOSEC) 20 MG capsule Take 20 mg by mouth daily.   Yes Historical Provider, MD   Triage Vitals: BP 123/94 mmHg  Pulse 84  Temp(Src) 98.7 F (37.1 C) (Oral)  Resp 20  Ht 5\' 3"  (1.6 m)  Wt 248 lb (112.492 kg)  BMI 43.94 kg/m2  SpO2 100% Physical Exam  Constitutional: She is oriented to person, place, and time. She appears well-developed and well-nourished. No distress.  HENT:  Head: Normocephalic and atraumatic.  Eyes: Conjunctivae and EOM are normal.  Neck: Neck supple.  Cardiovascular: Normal rate.   Pulmonary/Chest: Effort normal. No respiratory distress.  Genitourinary:  Left CVA tenderness  Musculoskeletal: Normal range of motion.  Neurological: She is alert and oriented to person, place, and time.  Negative straight leg raise   Skin: Skin is warm and dry.  Psychiatric: She has a normal mood and affect. Her behavior is normal.  Nursing note and vitals reviewed.   ED Course  Procedures (including critical care time) DIAGNOSTIC STUDIES: Oxygen Saturation is 100% on RA, nl by my interpretation.    COORDINATION OF CARE: 3:36 PM-Discussed treatment plan which includes UA with pt at bedside and pt agreed to plan.   Labs Review Labs Reviewed  URINALYSIS, ROUTINE W REFLEX MICROSCOPIC (NOT AT [facility])    Imaging Review No results found.   EKG Interpretation None      MDM   Final diagnoses:  UTI (lower urinary tract infection)   Keflex See your Physicain for recheck in 1 week avs  I personally  performed the services in this documentation, which was scribed in my presence.  The recorded information has been reviewed and considered.   Barnet Pall.   Lonia Skinner [place], PA-C 01/19/15 1645  Eber Hong, MD 01/21/15 [phone]

## 2015-01-19 NOTE — Discharge Instructions (Signed)

## 2015-01-19 NOTE — ED Notes (Signed)
PA at bedside.

## 2015-08-16 ENCOUNTER — Encounter (HOSPITAL_COMMUNITY): Payer: Self-pay | Admitting: Emergency Medicine

## 2015-08-16 ENCOUNTER — Emergency Department (HOSPITAL_COMMUNITY)
Admission: EM | Admit: 2015-08-16 | Discharge: 2015-08-16 | Discharge status: Against Medical Advice | Payer: Self-pay | Attending: Emergency Medicine | Admitting: Emergency Medicine

## 2015-08-16 DIAGNOSIS — R51 Headache: Secondary | ICD-10-CM | POA: Insufficient documentation

## 2015-08-16 DIAGNOSIS — I10 Essential (primary) hypertension: Secondary | ICD-10-CM | POA: Insufficient documentation

## 2015-08-16 NOTE — ED Notes (Signed)
Called for third time with no response 

## 2015-08-16 NOTE — ED Notes (Signed)
Patient c/o sinus pressure with green thick nasal drainage and occasional cough with sore throat. Per patient fever on Thursday and Friday but none since.

## 2015-08-16 NOTE — ED Notes (Signed)
Called for second time with no response  

## 2015-08-16 NOTE — ED Notes (Signed)
Called for pt with no response. 

## 2015-10-14 ENCOUNTER — Encounter (HOSPITAL_COMMUNITY): Payer: Self-pay | Admitting: Emergency Medicine

## 2015-10-14 ENCOUNTER — Other Ambulatory Visit (HOSPITAL_COMMUNITY)
Admission: RE | Admit: 2015-10-14 | Discharge: 2015-10-14 | Disposition: A | Discharge status: Home / Self Care | Payer: Self-pay | Source: Ambulatory Visit | Attending: Emergency Medicine | Admitting: Emergency Medicine

## 2015-10-14 ENCOUNTER — Emergency Department (INDEPENDENT_AMBULATORY_CARE_PROVIDER_SITE_OTHER)
Admission: EM | Admit: 2015-10-14 | Discharge: 2015-10-14 | Disposition: A | Discharge status: Home / Self Care | Payer: Self-pay | Source: Home / Self Care | Attending: Emergency Medicine | Admitting: Emergency Medicine

## 2015-10-14 DIAGNOSIS — J029 Acute pharyngitis, unspecified: Secondary | ICD-10-CM

## 2015-10-14 LAB — POCT RAPID STREP A: STREPTOCOCCUS, GROUP A SCREEN (DIRECT): NEGATIVE

## 2015-10-14 NOTE — Discharge Instructions (Signed)
Your strep test is negative. This is not the flu. You have a virus. Please continue symptomatic treatment at home. Stay away from people until you've been afebrile for 24 hours. Follow-up as needed.

## 2015-10-14 NOTE — ED Provider Notes (Signed)
CSN: [id number]     Arrival date & time 10/14/15  1923 History   First MD Initiated Contact with Patient 10/14/15 2029     Chief Complaint  Patient presents with  . Fever   (Consider location/radiation/quality/duration/timing/severity/associated sxs/prior Treatment) HPI  She is a 50 year old woman here for evaluation of sore throat. Her symptoms started about 48 hours ago with a scratchy throat. She developed headache and fever shortly thereafter. She reports very minimal nasal congestion and having to clear her throat a lot. Today, she has started to lose her voice. She reports persistent fevers. She has been taking Advil round-the-clock. No body aches. No nausea or vomiting.  Past Medical History  Diagnosis Date  . Hypertension    Past Surgical History  Procedure Laterality Date  . 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 Cesarean section    . Knee arthroscopy Left    Family History  Problem Relation Age of Onset  . Cancer Other   . Heart disease Other    Social History  Substance Use Topics  . Smoking status: Former Smoker -- 32 years    Types: Cigarettes    Quit date: 01/29/2013  . Smokeless tobacco: Never Used  . Alcohol Use: No   OB History    Gravida Para Term Preterm AB TAB SAB Ectopic Multiple Living   2 2 2       2      Review of Systems As in history of present illness Allergies  Ciprofloxacin; Nitrofuran derivatives; Other; and Sulfa antibiotics  Home Medications   Prior to Admission medications   Medication Sig Start Date End Date Taking? Authorizing Provider  atenolol (TENORMIN) 25 MG tablet Take 25 mg by mouth daily.    Historical Provider, MD  CALCIUM PO Take 1 tablet by mouth daily.    Historical Provider, MD  ibuprofen (ADVIL,MOTRIN) 200 MG tablet Take 800 mg by mouth 2 (two) times daily as needed for moderate pain.    Historical Provider, MD  loratadine (CLARITIN) 10 MG tablet Take 10 mg by mouth daily as needed for allergies.      Historical Provider, MD  methocarbamol (ROBAXIN) 500 MG tablet Take 1 tablet (500 mg total) by mouth 3 (three) times daily. 01/19/15   [name] Bryant, PA-C  omeprazole (PRILOSEC) 20 MG capsule Take 20 mg by mouth daily.    Historical Provider, MD   Meds Ordered and Administered this Visit  Medications - No data to display  BP 135/80 mmHg  Pulse 82  Temp(Src) 97.9 F (36.6 C) (Oral)  Resp 16  SpO2 100% No data found.   Physical Exam  Constitutional: She is oriented to person, place, and time. She appears well-developed and well-nourished. No distress.  HENT:  Mouth/Throat: No oropharyngeal exudate.  Oropharynx is erythematous. Nasal mucosa is normal.  Neck: Neck supple.  Cardiovascular: Normal rate, regular rhythm and normal heart sounds.   No murmur heard. Pulmonary/Chest: Effort normal and breath sounds normal. No respiratory distress. She has no wheezes. She has no rales.  Lymphadenopathy:    She has no cervical adenopathy.  Neurological: She is alert and oriented to person, place, and time.    ED Course  Procedures (including critical care time)  Labs Review Labs Reviewed  POCT RAPID STREP A    Imaging Review No results found.    MDM   1. Viral pharyngitis    Rapid strep negative. History is not consistent with flu. Continue symptomatic treatment at home.  Follow-up as needed.    Charm Rings, MD 10/14/15 2118

## 2015-10-14 NOTE — ED Notes (Signed)
Fever, sore throat, and headache.  Onset Monday evening of symptoms.

## 2015-10-17 LAB — CULTURE, GROUP A STREP (THRC)

## 2015-10-18 ENCOUNTER — Encounter (HOSPITAL_COMMUNITY): Payer: Self-pay | Admitting: *Deleted

## 2015-10-18 ENCOUNTER — Emergency Department (HOSPITAL_COMMUNITY)
Admission: EM | Admit: 2015-10-18 | Discharge: 2015-10-18 | Disposition: A | Discharge status: Home / Self Care | Payer: Self-pay | Attending: Emergency Medicine | Admitting: Emergency Medicine

## 2015-10-18 DIAGNOSIS — J111 Influenza due to unidentified influenza virus with other respiratory manifestations: Secondary | ICD-10-CM | POA: Insufficient documentation

## 2015-10-18 DIAGNOSIS — I10 Essential (primary) hypertension: Secondary | ICD-10-CM | POA: Insufficient documentation

## 2015-10-18 DIAGNOSIS — R69 Illness, unspecified: Secondary | ICD-10-CM

## 2015-10-18 DIAGNOSIS — Z87891 Personal history of nicotine dependence: Secondary | ICD-10-CM | POA: Insufficient documentation

## 2015-10-18 MED ORDER — BENZONATATE 100 MG PO CAPS: 100.0000 mg | ORAL_CAPSULE | Freq: Three times a day (TID) | ORAL | Status: DC

## 2015-10-18 MED ORDER — IBUPROFEN 800 MG PO TABS: 800.0000 mg | ORAL_TABLET | Freq: Three times a day (TID) | ORAL | Status: DC

## 2015-10-18 MED ORDER — DEXAMETHASONE SODIUM PHOSPHATE 10 MG/ML IJ SOLN
10.0000 mg | Freq: Once | INTRAMUSCULAR | Status: AC
  Administered 2015-10-18: 10 mg via INTRAMUSCULAR
  Filled 2015-10-18: qty 1

## 2015-10-18 NOTE — ED Provider Notes (Signed)
CSN: [id number]     Arrival date & time 10/18/15  0731 History   First MD Initiated Contact with Patient 10/18/15 [id number]     Chief Complaint  Patient presents with  . Cough     (Consider location/radiation/quality/duration/timing/severity/associated sxs/prior Treatment) Patient is a 50 y.o. female presenting with cough.  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  She has ongoing constant sx which are moderate - she has 2 sick family members with the flu diagnosed and she has patient care responsibilities as a Engineer, civil (consulting)nurse for [employer] and is concerned she may have the flu.  She has been using OTC meds without relief.  Past Medical History  Diagnosis Date  . Hypertension    Past Surgical History  Procedure Laterality Date  . 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 Cesarean section    . Knee arthroscopy Left    Family History  Problem Relation Age of Onset  . Cancer Other   . Heart disease Other    Social History  Substance Use Topics  . Smoking status: Former Smoker -- 32 years    Types: Cigarettes    Quit date: 01/29/2013  . Smokeless tobacco: Never Used  . Alcohol Use: No   OB History    Gravida Para Term Preterm AB TAB SAB Ectopic Multiple Living   2 2 2       2      Review of Systems  Constitutional: Positive for chills and fatigue.  HENT: Positive for sore throat.   Respiratory: Positive for cough.       Allergies  Ciprofloxacin; Nitrofuran derivatives; Other; and Sulfa antibiotics  Home Medications   Prior to Admission medications   Medication Sig Start Date End Date  Taking? Authorizing Provider  atenolol (TENORMIN) 25 MG tablet Take 25 mg by mouth daily.    Historical Provider, MD  benzonatate (TESSALON) 100 MG capsule Take 1 capsule (100 mg total) by mouth every 8 (eight) hours. 10/18/15   Eber HongBrian Janyra Barillas, MD  CALCIUM PO Take 1 tablet by mouth daily.    Historical Provider, MD  ibuprofen (ADVIL,MOTRIN) 800 MG tablet Take 1 tablet (800 mg total) by mouth 3 (three) times daily. 10/18/15   Eber HongBrian Yurianna Tusing, MD  loratadine (CLARITIN) 10 MG tablet Take 10 mg by mouth daily as needed for allergies.     Historical Provider, MD  methocarbamol (ROBAXIN) 500 MG tablet Take 1 tablet (500 mg total) by mouth 3 (three) times daily. 01/19/15   [name] Bryant, PA-C  omeprazole (PRILOSEC) 20 MG capsule Take 20 mg by mouth daily.    Historical Provider, MD   BP 150/97 mmHg  Pulse 82  Temp(Src) 98.2 F (36.8 C) (Oral)  Resp 18  Ht 5\' 3"  (1.6 m)  Wt 225 lb (102.059 kg)  BMI 39.87 kg/m2  SpO2 98% Physical Exam  Constitutional: She appears well-developed and well-nourished.  HENT:  Head: Normocephalic and atraumatic.  Erythematous OP, no exudate, swelling, hypertrophy.  Uvula midline.  Eyes: Conjunctivae are normal. Right eye exhibits no discharge. Left  eye exhibits no discharge.  Cardiovascular: Normal rate.   Pulmonary/Chest: Effort normal and breath sounds normal. No respiratory distress. She has no wheezes. She has no rales.  Lymphadenopathy:    She has no cervical adenopathy.  Neurological: She is alert. Coordination normal.  Skin: Skin is warm and dry. No rash noted. She is not diaphoretic. No erythema.  Psychiatric: She has a normal mood and affect.  Nursing note and vitals reviewed.   ED Course  Procedures (including critical care time) Labs Review Labs Reviewed - No data to display  Imaging Review No results found. I have personally reviewed and evaluated these images and lab results as part of my medical decision-making.    MDM   Final diagnoses:   Influenza-like illness    Well appaering, likely Flu / upper viral, VS normal and reassuring, lungs clear No testing for Flu at this point - pt in agreement Decadron for sx, Tessalon for cough Pt in agreement with plan.  Meds given in ED:  Medications  dexamethasone (DECADRON) injection 10 mg (10 mg Intramuscular Given 10/18/15 0808)    New Prescriptions   BENZONATATE (TESSALON) 100 MG CAPSULE    Take 1 capsule (100 mg total) by mouth every 8 (eight) hours.   IBUPROFEN (ADVIL,MOTRIN) 800 MG TABLET    Take 1 tablet (800 mg total) by mouth 3 (three) times daily.        Eber Hong, MD 10/18/15 [phone]

## 2015-10-18 NOTE — Discharge Instructions (Signed)

## 2015-10-18 NOTE — ED Notes (Signed)
Pt comes in for fever, chills, cough, nasal congestion, and productive cough since Monday. Pt went to urgent care on Wednesday and was told she had viral pharyngitis. Pt states she is progressively getting worse.

## 2017-04-25 ENCOUNTER — Emergency Department (HOSPITAL_COMMUNITY)
Admission: EM | Admit: 2017-04-25 | Discharge: 2017-04-25 | Disposition: A | Discharge status: Home / Self Care | Payer: Self-pay | Attending: Emergency Medicine | Admitting: Emergency Medicine

## 2017-04-25 ENCOUNTER — Encounter (HOSPITAL_COMMUNITY): Payer: Self-pay | Admitting: *Deleted

## 2017-04-25 DIAGNOSIS — Z79899 Other long term (current) drug therapy: Secondary | ICD-10-CM | POA: Insufficient documentation

## 2017-04-25 DIAGNOSIS — J020 Streptococcal pharyngitis: Secondary | ICD-10-CM | POA: Insufficient documentation

## 2017-04-25 DIAGNOSIS — I10 Essential (primary) hypertension: Secondary | ICD-10-CM | POA: Insufficient documentation

## 2017-04-25 DIAGNOSIS — Z87891 Personal history of nicotine dependence: Secondary | ICD-10-CM | POA: Insufficient documentation

## 2017-04-25 LAB — RAPID STREP SCREEN (MED CTR MEBANE ONLY): STREPTOCOCCUS, GROUP A SCREEN (DIRECT): POSITIVE — AB

## 2017-04-25 MED ORDER — CEPHALEXIN 500 MG PO CAPS: 500.0000 mg | ORAL_CAPSULE | Freq: Four times a day (QID) | ORAL | 0 refills | Status: DC

## 2017-04-25 NOTE — ED Triage Notes (Signed)
Pt c/o sore throat that started yesterday and has gotten progressively worse

## 2017-04-25 NOTE — ED Provider Notes (Signed)
[facility]-EMERGENCY DEPT Provider Note   CSN: [id number] Arrival date & time: 04/25/17  0537     History   Chief Complaint Chief Complaint  Patient presents with  . Sore Throat    HPI [name] is a 51 y.o. female.  Patient is a 51 year old female presenting with sore throat. This started 2 days ago and is worsening. Is worse when she swallows. She denies any shortness of breath. She does report low-grade fevers.   The history is provided by the patient.  Sore Throat  This is a new problem. The current episode started yesterday. The problem occurs constantly. The problem has been rapidly worsening. The symptoms are aggravated by swallowing. Nothing relieves the symptoms. Treatments tried: ibuprofen. The treatment provided no relief.    Past Medical History:  Diagnosis Date  . Hypertension     There are no active problems to display for this patient.   Past Surgical History:  Procedure Laterality Date  . CESAREAN SECTION    . EAR MASTOIDECTOMY W/ COCHLEAR IMPLANT W/ LANDMARK    . KNEE ARTHROSCOPY Left     OB History    Gravida Para Term Preterm AB Living   SAB TAB Ectopic Multiple Live Births                   Home Medications    Prior to Admission medications   Medication Sig Start Date End Date Taking? Authorizing Provider  Biotin 5 MG TABS Take by mouth.   Yes [provider]  Cyanocobalamin (VITAMIN B 12 PO) Take by mouth.   Yes [provider]  atenolol (TENORMIN) 25 MG tablet Take 25 mg by mouth daily.    [provider]  benzonatate (TESSALON) 100 MG capsule Take 1 capsule (100 mg total) by mouth every 8 (eight) hours. 10/18/15   Eber Hong, MD  CALCIUM PO Take 1 tablet by mouth daily.    [provider]  ibuprofen (ADVIL,MOTRIN) 800 MG tablet Take 1 tablet (800 mg total) by mouth 3 (three) times daily. 10/18/15   Eber Hong, MD  loratadine (CLARITIN) 10 MG tablet Take 10 mg by mouth daily as  needed for allergies.     [provider]  methocarbamol (ROBAXIN) 500 MG tablet Take 1 tablet (500 mg total) by mouth 3 (three) times daily. 01/19/15   Ivery Quale, PA-C  omeprazole (PRILOSEC) 20 MG capsule Take 20 mg by mouth daily.    [provider]    Family History Family History  Problem Relation Age of Onset  . Cancer Other   . Heart disease Other     Social History Social History  Substance Use Topics  . Smoking status: Former Smoker    Years: 32.00    Types: Cigarettes    Quit date: 01/29/2013  . Smokeless tobacco: Never Used  . Alcohol use No     Allergies   Ciprofloxacin; Nitrofuran derivatives; Other; and Sulfa antibiotics   Review of Systems Review of Systems  All other systems reviewed and are negative.    Physical Exam Updated Vital Signs BP (!) 134/91 (BP Location: Right Arm)   Pulse 80   Temp 98.6 F (37 C) (Oral)   Resp 18   Ht 5' 2.5" (1.588 m)   Wt 101.2 kg (223 lb)   SpO2 98%   BMI 40.14 kg/m   Physical Exam  Constitutional: She is oriented to person, place, and  time. She appears well-developed and well-nourished. No distress.  HENT:  Head: Normocephalic and atraumatic.  Oropharynx is erythematous with no exudates.  TMs are clear bilaterally.  Neck: Normal range of motion. Neck supple.  Cardiovascular: Normal rate and regular rhythm.  Exam reveals no gallop and no friction rub.   No murmur heard. Pulmonary/Chest: Effort normal and breath sounds normal. No respiratory distress. She has no wheezes.  Abdominal: Soft. Bowel sounds are normal. She exhibits no distension. There is no tenderness.  Musculoskeletal: Normal range of motion.  Lymphadenopathy:    She has cervical adenopathy.  Neurological: She is alert and oriented to person, place, and time.  Skin: Skin is warm and dry. She is not diaphoretic.  Nursing note and vitals reviewed.    ED Treatments / Results  Labs (all labs ordered are listed, but only  abnormal results are displayed) Labs Reviewed  RAPID STREP SCREEN (NOT AT [facility]) - Abnormal; Notable for the following:       Result Value   Streptococcus, Group A Screen (Direct) POSITIVE (*)    All other components within normal limits    EKG  EKG Interpretation None       Radiology No results found.  Procedures Procedures (including critical care time)  Medications Ordered in ED Medications - No data to display   Initial Impression / Assessment and Plan / ED Course  I have reviewed the triage vital signs and the nursing notes.  Pertinent labs & imaging results that were available during my care of the patient were reviewed by me and considered in my medical decision making (see chart for details).  Strep test is positive. She will be treated with Keflex, continued ibuprofen, and follow-up as needed.  Final Clinical Impressions(s) / ED Diagnoses   Final diagnoses:  None    New Prescriptions New Prescriptions   No medications on file     Geoffery Lyons, MD 04/25/17 [phone]

## 2017-04-25 NOTE — Discharge Instructions (Signed)
Keflex as prescribed.  Ibuprofen 600 mg every 6 hours as needed for pain or fever.  Follow-up with your primary Dr. If not improving in the next 3-4 days, and return to the ER if you develop difficulty breathing or swallowing, or other new and concerning symptoms.

## 2017-07-06 ENCOUNTER — Emergency Department (HOSPITAL_COMMUNITY)
Admission: EM | Admit: 2017-07-06 | Discharge: 2017-07-06 | Disposition: A | Discharge status: Home / Self Care | Payer: 59 | Attending: Emergency Medicine | Admitting: Emergency Medicine

## 2017-07-06 ENCOUNTER — Encounter (HOSPITAL_COMMUNITY): Payer: Self-pay | Admitting: Emergency Medicine

## 2017-07-06 ENCOUNTER — Other Ambulatory Visit: Payer: Self-pay

## 2017-07-06 DIAGNOSIS — Z79899 Other long term (current) drug therapy: Secondary | ICD-10-CM | POA: Insufficient documentation

## 2017-07-06 DIAGNOSIS — I10 Essential (primary) hypertension: Secondary | ICD-10-CM | POA: Insufficient documentation

## 2017-07-06 DIAGNOSIS — Z87891 Personal history of nicotine dependence: Secondary | ICD-10-CM | POA: Insufficient documentation

## 2017-07-06 DIAGNOSIS — R0981 Nasal congestion: Secondary | ICD-10-CM | POA: Insufficient documentation

## 2017-07-06 DIAGNOSIS — H9202 Otalgia, left ear: Secondary | ICD-10-CM

## 2017-07-06 MED ORDER — IBUPROFEN 600 MG PO TABS: 600.0000 mg | ORAL_TABLET | Freq: Four times a day (QID) | ORAL | 0 refills | Status: DC

## 2017-07-06 MED ORDER — AMOXICILLIN 250 MG PO CAPS
500.0000 mg | ORAL_CAPSULE | Freq: Once | ORAL | Status: AC
  Administered 2017-07-06: 500 mg via ORAL
  Filled 2017-07-06: qty 2

## 2017-07-06 MED ORDER — AMOXICILLIN 500 MG PO CAPS: 500.0000 mg | ORAL_CAPSULE | Freq: Three times a day (TID) | ORAL | 0 refills | Status: DC

## 2017-07-06 MED ORDER — IBUPROFEN 800 MG PO TABS
800.0000 mg | ORAL_TABLET | Freq: Once | ORAL | Status: AC
  Administered 2017-07-06: 800 mg via ORAL
  Filled 2017-07-06: qty 1

## 2017-07-06 MED ORDER — ONDANSETRON HCL 4 MG PO TABS
4.0000 mg | ORAL_TABLET | Freq: Once | ORAL | Status: AC
  Administered 2017-07-06: 4 mg via ORAL
  Filled 2017-07-06: qty 1

## 2017-07-06 NOTE — ED Provider Notes (Signed)
[facility] EMERGENCY DEPARTMENT Provider Note   CSN: [id number] Arrival date & time: 07/06/17  1858     History   Chief Complaint Chief Complaint  Patient presents with  . Otalgia    HPI [name] is a 51 y.o. female.  Patient is a 51 year old female who presents to the emergency department with left ear pain.  The patient states that she has had problems with her right ear since childhood.  She has had 8 or 9 operations on the right ear.  Earlier today she began to have pain involving the left ear.  She noticed some yellowish drainage from the ear with specks of blood in it.  She has pain just in front of the ear and extending down toward the neck.  She states that she has problems hearing out of the ear.  She presents to the emergency department for evaluation.      Past Medical History:  Diagnosis Date  . Hypertension     There are no active problems to display for this patient.   Past Surgical History:  Procedure Laterality Date  . CESAREAN SECTION    . EAR MASTOIDECTOMY W/ COCHLEAR IMPLANT W/ LANDMARK    . KNEE ARTHROSCOPY Left     OB History    Gravida Para Term Preterm AB Living   2 2 2     2    SAB TAB Ectopic Multiple Live Births                   Home Medications    Prior to Admission medications   Medication Sig Start Date End Date Taking? Authorizing Provider  amoxicillin (AMOXIL) 500 MG capsule Take 1 capsule (500 mg total) by mouth 3 (three) times daily. 07/06/17   Ivery QualeBryant, Yulitza Shorts, PA-C  atenolol (TENORMIN) 25 MG tablet Take 25 mg by mouth daily.    [provider]  benzonatate (TESSALON) 100 MG capsule Take 1 capsule (100 mg total) by mouth every 8 (eight) hours. 10/18/15   Eber HongMiller, Brian, MD  Biotin 5 MG TABS Take by mouth.    [provider]  CALCIUM PO Take 1 tablet by mouth daily.    [provider]  cephALEXin (KEFLEX) 500 MG capsule Take 1 capsule (500 mg total) by mouth 4 (four) times daily. 04/25/17    Geoffery Lyonselo, Douglas, MD  Cyanocobalamin (VITAMIN B 12 PO) Take by mouth.    [provider]  ibuprofen (ADVIL,MOTRIN) 600 MG tablet Take 1 tablet (600 mg total) by mouth 4 (four) times daily. 07/06/17   Ivery QualeBryant, Keyunna Coco, PA-C  loratadine (CLARITIN) 10 MG tablet Take 10 mg by mouth daily as needed for allergies.     [provider]  methocarbamol (ROBAXIN) 500 MG tablet Take 1 tablet (500 mg total) by mouth 3 (three) times daily. 01/19/15   Ivery QualeBryant, Rane Blitch, PA-C  omeprazole (PRILOSEC) 20 MG capsule Take 20 mg by mouth daily.    [provider]    Family History Family History  Problem Relation Age of Onset  . Cancer Other   . Heart disease Other     Social History Social History   Tobacco Use  . Smoking status: Former Smoker    Years: 32.00    Types: Cigarettes    Last attempt to quit: 01/29/2013    Years since quitting: 4.4  . Smokeless tobacco: Never Used  Substance Use Topics  . Alcohol use: No  . Drug use: No     Allergies  Ciprofloxacin; Keflex [cephalexin]; Nitrofuran derivatives; Other; and Sulfa antibiotics   Review of Systems Review of Systems  Constitutional: Negative for activity change.       All ROS Neg except as noted in HPI  HENT: Positive for ear discharge, ear pain and postnasal drip. Negative for nosebleeds, sore throat, trouble swallowing and voice change.   Eyes: Negative for photophobia and discharge.  Respiratory: Negative for cough, shortness of breath and wheezing.   Cardiovascular: Negative for chest pain and palpitations.  Gastrointestinal: Negative for abdominal pain and blood in stool.  Genitourinary: Negative for dysuria, frequency and hematuria.  Musculoskeletal: Negative for arthralgias, back pain and neck pain.  Skin: Negative.   Neurological: Negative for dizziness, seizures and speech difficulty.  Psychiatric/Behavioral: Negative for confusion and hallucinations.     Physical Exam Updated Vital Signs BP (!) 153/85  (BP Location: Right Arm)   Pulse 77   Temp 98.9 F (37.2 C) (Oral)   Resp 18   SpO2 100%   Physical Exam  Constitutional: She is oriented to person, place, and time. She appears well-developed and well-nourished.  Non-toxic appearance.  HENT:  Head: Normocephalic.  Right Ear: Tympanic membrane normal.  Left Ear: Tympanic membrane normal.  Mouth/Throat: Oropharynx is clear and moist.  Nasal congestion present.  There is some tenderness in the preauricular area.  There is no redness or swelling in the mastoid area.  There is no redness or swelling of the external ear.  The external canal shows no lesions or no drainage at this time.  There is blockage of the tympanic membrane.  And the tympanic membrane is not visible.  Eyes: EOM and lids are normal. Pupils are equal, round, and reactive to light.  Neck: Normal range of motion. Neck supple. Carotid bruit is not present.  Patient has tenderness in the cervical chain on the left.  I do not feel palpable nodes, but there is tenderness to palpation in the cervical node area.  Cardiovascular: Normal rate, regular rhythm, normal heart sounds, intact distal pulses and normal pulses.  Pulmonary/Chest: Breath sounds normal. No respiratory distress.  Abdominal: Soft. Bowel sounds are normal. There is no tenderness. There is no guarding.  Musculoskeletal: Normal range of motion.  Lymphadenopathy:       Head (right side): No submandibular adenopathy present.       Head (left side): No submandibular adenopathy present.    She has no cervical adenopathy.  Neurological: She is alert and oriented to person, place, and time. She has normal strength. No cranial nerve deficit or sensory deficit.  Skin: Skin is warm and dry. No rash noted.  Psychiatric: She has a normal mood and affect. Her speech is normal.  Nursing note and vitals reviewed.    ED Treatments / Results  Labs (all labs ordered are listed, but only abnormal results are displayed) Labs  Reviewed - No data to display  EKG  EKG Interpretation None       Radiology No results found.  Procedures Procedures (including critical care time)  Medications Ordered in ED Medications  amoxicillin (AMOXIL) capsule 500 mg (not administered)  ibuprofen (ADVIL,MOTRIN) tablet 800 mg (not administered)  ondansetron (ZOFRAN) tablet 4 mg (not administered)     Initial Impression / Assessment and Plan / ED Course  I have reviewed the triage vital signs and the nursing notes.  Pertinent labs & imaging results that were available during my care of the patient were reviewed by me and considered in my medical decision  making (see chart for details).       Final Clinical Impressions(s) / ED Diagnoses MDM Vital signs are within normal limits.  Pulse oximetry is 100% on room air.  Within normal limits by my interpretation.  The tympanic membrane could not be visualized at this time.  There is no active drainage of the left ear at this time.  There is no mastoid involvement on examination.  Patient does have sinus congestion.  I have asked the patient to use a decongesting medication of her choice.  To use 600 mg of ibuprofen every 6 hours, and use Tylenol in between the ibuprofen for assistance with pain and discomfort.  Prescription for Amoxil given to the patient.  Patient is referred to Dr.Teoh for ear nose and throat evaluation given the patient's history of problems with the right ear and now having some changes in her hearing on the left.   Final diagnoses:  Left ear pain    ED Discharge Orders        Ordered    ibuprofen (ADVIL,MOTRIN) 600 MG tablet  4 times daily     07/06/17 2009    amoxicillin (AMOXIL) 500 MG capsule  3 times daily     07/06/17 2010       Ivery QualeBryant, Belvia Gotschall, PA-C 07/06/17 2023    Vanetta MuldersZackowski, Scott, MD 07/07/17 [phone]

## 2017-07-06 NOTE — ED Triage Notes (Signed)
Pt c/o pain and bloody pus coming out of left ear. Pt states it feels clogged up. 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

## 2017-07-06 NOTE — Discharge Instructions (Signed)
Your vital signs are within normal limits.  On your examination the eardrum/tympanic membrane is difficult to visualize at this time.  This seems to be tenderness around the ear and also extending from the ear toward the neck.  Please use Amoxil 3 times daily with food.  Use 600 mg of ibuprofen with breakfast, lunch, dinner, and at bedtime.  May use Tylenol in between the ibuprofen doses if needed for pain.  Please call Dr.Teoh tomorrow to set up an appointment as soon as possible.

## 2017-11-01 DIAGNOSIS — F419 Anxiety disorder, unspecified: Secondary | ICD-10-CM | POA: Insufficient documentation

## 2018-05-23 ENCOUNTER — Encounter (HOSPITAL_COMMUNITY): Payer: Self-pay | Admitting: Emergency Medicine

## 2018-05-23 ENCOUNTER — Emergency Department (HOSPITAL_COMMUNITY)
Admission: EM | Admit: 2018-05-23 | Discharge: 2018-05-23 | Disposition: A | Discharge status: Home / Self Care | Payer: Self-pay | Attending: Emergency Medicine | Admitting: Emergency Medicine

## 2018-05-23 ENCOUNTER — Other Ambulatory Visit: Payer: Self-pay

## 2018-05-23 DIAGNOSIS — H6690 Otitis media, unspecified, unspecified ear: Secondary | ICD-10-CM

## 2018-05-23 DIAGNOSIS — Z79899 Other long term (current) drug therapy: Secondary | ICD-10-CM | POA: Insufficient documentation

## 2018-05-23 DIAGNOSIS — H6122 Impacted cerumen, left ear: Secondary | ICD-10-CM | POA: Insufficient documentation

## 2018-05-23 DIAGNOSIS — R0981 Nasal congestion: Secondary | ICD-10-CM | POA: Insufficient documentation

## 2018-05-23 DIAGNOSIS — J3489 Other specified disorders of nose and nasal sinuses: Secondary | ICD-10-CM | POA: Insufficient documentation

## 2018-05-23 DIAGNOSIS — I10 Essential (primary) hypertension: Secondary | ICD-10-CM | POA: Insufficient documentation

## 2018-05-23 DIAGNOSIS — H6692 Otitis media, unspecified, left ear: Secondary | ICD-10-CM | POA: Insufficient documentation

## 2018-05-23 DIAGNOSIS — Z87891 Personal history of nicotine dependence: Secondary | ICD-10-CM | POA: Insufficient documentation

## 2018-05-23 MED ORDER — AMOXICILLIN 250 MG PO CAPS
500.0000 mg | ORAL_CAPSULE | Freq: Once | ORAL | Status: AC
  Administered 2018-05-23: 500 mg via ORAL
  Filled 2018-05-23: qty 2

## 2018-05-23 MED ORDER — AMOXICILLIN 500 MG PO CAPS: 500.0000 mg | ORAL_CAPSULE | Freq: Three times a day (TID) | ORAL | 0 refills | Status: AC

## 2018-05-23 NOTE — ED Provider Notes (Signed)
[facility] EMERGENCY DEPARTMENT Provider Note   CSN: [id number] Arrival date & time: 05/23/18  1859     History   Chief Complaint Chief Complaint  Patient presents with  . Otalgia    HPI [name] is a 52 y.o. female with a history of seasonal allergies and past multiple bilateral ear infections and mastoid infections resulting in ultimate surgical intervention for this problem suspects she has a left otitis media.  She has developed falls seasonal allergies including nasal congestion and clear to yellow nasal discharge along with several episodes of bloody streaking despite taking her Claritin which usually improves her allergy symptoms.  She is developed left ear pain over the past several days suggestive of an otitis media.  She has had subjective fever, denies sore throat, denies headache neck pain or stiffness and has had no coughing or shortness of breath.  She has had no medications for the ear pain specifically prior to arrival.  The history is provided by the patient.    Past Medical History:  Diagnosis Date  . Hypertension     There are no active problems to display for this patient.   Past Surgical History:  Procedure Laterality Date  . CESAREAN SECTION    . EAR MASTOIDECTOMY W/ COCHLEAR IMPLANT W/ LANDMARK    . KNEE ARTHROSCOPY Left      OB History    Gravida  2   Para  2   Term  2   Preterm      AB      Living  2     SAB      TAB      Ectopic      Multiple      Live Births               Home Medications    Prior to Admission medications   Medication Sig Start Date End Date Taking? Authorizing Provider  amoxicillin (AMOXIL) 500 MG capsule Take 1 capsule (500 mg total) by mouth 3 (three) times daily for 10 days. 05/23/18 06/02/18  Burgess Amor, PA-C  atenolol (TENORMIN) 25 MG tablet Take 25 mg by mouth daily.    [provider]  benzonatate (TESSALON) 100 MG capsule Take 1 capsule (100 mg total) by mouth every 8  (eight) hours. 10/18/15   Eber Hong, MD  Biotin 5 MG TABS Take by mouth.    [provider]  CALCIUM PO Take 1 tablet by mouth daily.    [provider]  cephALEXin (KEFLEX) 500 MG capsule Take 1 capsule (500 mg total) by mouth 4 (four) times daily. 04/25/17   Geoffery Lyons, MD  Cyanocobalamin (VITAMIN B 12 PO) Take by mouth.    [provider]  ibuprofen (ADVIL,MOTRIN) 600 MG tablet Take 1 tablet (600 mg total) by mouth 4 (four) times daily. 07/06/17   Ivery Quale, PA-C  loratadine (CLARITIN) 10 MG tablet Take 10 mg by mouth daily as needed for allergies.     [provider]  methocarbamol (ROBAXIN) 500 MG tablet Take 1 tablet (500 mg total) by mouth 3 (three) times daily. 01/19/15   Ivery Quale, PA-C  omeprazole (PRILOSEC) 20 MG capsule Take 20 mg by mouth daily.    [provider]    Family History Family History  Problem Relation Age of Onset  . Cancer Other   . Heart disease Other     Social History Social History   Tobacco Use  . Smoking status:  Former Smoker    Years: 32.00    Types: Cigarettes    Last attempt to quit: 01/29/2013    Years since quitting: 5.3  . Smokeless tobacco: Never Used  Substance Use Topics  . Alcohol use: No  . Drug use: No     Allergies   Ciprofloxacin; Keflex [cephalexin]; Nitrofuran derivatives; Other; and Sulfa antibiotics   Review of Systems Review of Systems  Constitutional: Negative for chills and fever.  HENT: Positive for congestion, ear pain, hearing loss, postnasal drip, rhinorrhea and sinus pain. Negative for sinus pressure, sore throat, trouble swallowing and voice change.   Eyes: Negative for discharge.  Respiratory: Negative for cough, shortness of breath, wheezing and stridor.   Cardiovascular: Negative for chest pain.  Gastrointestinal: Negative for abdominal pain.  Genitourinary: Negative.      Physical Exam Updated Vital Signs BP (!) 147/72 (BP Location: Left Arm)    Pulse 73   Temp 98.4 F (36.9 C) (Oral)   Resp 18   Ht 5\' 3"  (1.6 m)   Wt 101.2 kg   SpO2 100%   BMI 39.50 kg/m   Physical Exam  Constitutional: She is oriented to person, place, and time. She appears well-developed and well-nourished.  HENT:  Head: Normocephalic and atraumatic.  Right Ear: Tympanic membrane and ear canal normal.  Left Ear: A foreign body is present.  Nose: Mucosal edema and rhinorrhea present.  Mouth/Throat: Uvula is midline, oropharynx is clear and moist and mucous membranes are normal. No oropharyngeal exudate, posterior oropharyngeal edema, posterior oropharyngeal erythema or tonsillar abscesses.  Cerumen impaction left ear canal.  Right ear canal is clear and unremarkable.  Eyes: Conjunctivae are normal.  Neck: Normal range of motion.  Cardiovascular: Normal rate and normal heart sounds.  Pulmonary/Chest: Effort normal and breath sounds normal. No respiratory distress.  Musculoskeletal: Normal range of motion.  Neurological: She is alert and oriented to person, place, and time.  Skin: Skin is warm and dry. No rash noted.  Psychiatric: She has a normal mood and affect.     ED Treatments / Results  Labs (all labs ordered are listed, but only abnormal results are displayed) Labs Reviewed - No data to display  EKG None  Radiology No results found.  Procedures Procedures (including critical care time)  Medications Ordered in ED Medications  amoxicillin (AMOXIL) capsule 500 mg (500 mg Oral Given 05/23/18 2040)     Initial Impression / Assessment and Plan / ED Course  I have reviewed the triage vital signs and the nursing notes.  Pertinent labs & imaging results that were available during my care of the patient were reviewed by me and considered in my medical decision making (see chart for details).     Offered flushing for removal of ear impaction which patient refused.  She prefers to have her ENT physician do this who is Dr. Narda Bonds.   Although I am unable to visualize the TM, she does have risk factors for an otitis with the nasal congestion and new pain, although I also explained to her that a cerumen impaction can also be painful..  With the bloody nasal discharge I suspect she may have a early sinusitis if not a full-blown left otitis media.  She was covered with amoxicillin and advised to follow-up with her ENT specialist.  Patient agrees with this plan.  Final Clinical Impressions(s) / ED Diagnoses   Final diagnoses:  Acute otitis media, unspecified otitis media type  Impacted cerumen of left ear  ED Discharge Orders         Ordered    amoxicillin (AMOXIL) 500 MG capsule  3 times daily     05/23/18 2022           Burgess Amor, PA-C 05/24/18 1728    Terrilee Files, MD 05/25/18 1137

## 2018-05-23 NOTE — Discharge Instructions (Addendum)
As discussed I am unable to see your left eardrum given the cerumen impaction, but given your symptoms I agree that you probably do have an infection in this year, as well as a possible early sinus infection.  Use the amoxicillin as prescribed.  Plan to see your ENT specialist as needed for further management if this condition does not improve.  You may also take Tylenol if needed for pain relief.

## 2018-05-23 NOTE — ED Triage Notes (Signed)
Sinus symptoms x 2 weeks, L otalgia for 2-3 days.

## 2018-06-14 ENCOUNTER — Other Ambulatory Visit: Payer: Self-pay | Admitting: Nurse Practitioner

## 2018-06-15 ENCOUNTER — Other Ambulatory Visit: Payer: Self-pay | Admitting: Nurse Practitioner

## 2018-06-15 DIAGNOSIS — R102 Pelvic and perineal pain: Secondary | ICD-10-CM

## 2018-07-02 ENCOUNTER — Other Ambulatory Visit: Payer: Self-pay | Admitting: Family Medicine

## 2018-09-02 ENCOUNTER — Other Ambulatory Visit: Payer: Self-pay

## 2018-09-02 ENCOUNTER — Emergency Department (HOSPITAL_COMMUNITY): Payer: No Typology Code available for payment source

## 2018-09-02 ENCOUNTER — Encounter (HOSPITAL_COMMUNITY): Payer: Self-pay | Admitting: Emergency Medicine

## 2018-09-02 ENCOUNTER — Inpatient Hospital Stay (HOSPITAL_COMMUNITY)
Admission: EM | Admit: 2018-09-02 | Discharge: 2018-09-05 | DRG: 511 | Disposition: A | Discharge status: Home Health | Payer: No Typology Code available for payment source | Attending: Family Medicine | Admitting: Family Medicine

## 2018-09-02 DIAGNOSIS — K0889 Other specified disorders of teeth and supporting structures: Secondary | ICD-10-CM | POA: Diagnosis present

## 2018-09-02 DIAGNOSIS — Z87891 Personal history of nicotine dependence: Secondary | ICD-10-CM

## 2018-09-02 DIAGNOSIS — Z9621 Cochlear implant status: Secondary | ICD-10-CM | POA: Diagnosis present

## 2018-09-02 DIAGNOSIS — E875 Hyperkalemia: Secondary | ICD-10-CM | POA: Diagnosis present

## 2018-09-02 DIAGNOSIS — Z881 Allergy status to other antibiotic agents status: Secondary | ICD-10-CM

## 2018-09-02 DIAGNOSIS — L8990 Pressure ulcer of unspecified site, unspecified stage: Secondary | ICD-10-CM | POA: Diagnosis present

## 2018-09-02 DIAGNOSIS — R911 Solitary pulmonary nodule: Secondary | ICD-10-CM

## 2018-09-02 DIAGNOSIS — I10 Essential (primary) hypertension: Secondary | ICD-10-CM | POA: Diagnosis present

## 2018-09-02 DIAGNOSIS — S52571A Other intraarticular fracture of lower end of right radius, initial encounter for closed fracture: Principal | ICD-10-CM | POA: Diagnosis present

## 2018-09-02 DIAGNOSIS — Y92411 Interstate highway as the place of occurrence of the external cause: Secondary | ICD-10-CM

## 2018-09-02 DIAGNOSIS — S62101A Fracture of unspecified carpal bone, right wrist, initial encounter for closed fracture: Secondary | ICD-10-CM

## 2018-09-02 DIAGNOSIS — L89151 Pressure ulcer of sacral region, stage 1: Secondary | ICD-10-CM | POA: Diagnosis present

## 2018-09-02 DIAGNOSIS — S62109A Fracture of unspecified carpal bone, unspecified wrist, initial encounter for closed fracture: Secondary | ICD-10-CM

## 2018-09-02 DIAGNOSIS — S82841A Displaced bimalleolar fracture of right lower leg, initial encounter for closed fracture: Secondary | ICD-10-CM | POA: Diagnosis present

## 2018-09-02 DIAGNOSIS — M25571 Pain in right ankle and joints of right foot: Secondary | ICD-10-CM | POA: Diagnosis not present

## 2018-09-02 DIAGNOSIS — Z9089 Acquired absence of other organs: Secondary | ICD-10-CM

## 2018-09-02 DIAGNOSIS — Z8249 Family history of ischemic heart disease and other diseases of the circulatory system: Secondary | ICD-10-CM

## 2018-09-02 DIAGNOSIS — S301XXA Contusion of abdominal wall, initial encounter: Secondary | ICD-10-CM | POA: Diagnosis present

## 2018-09-02 DIAGNOSIS — Z6841 Body Mass Index (BMI) 40.0 and over, adult: Secondary | ICD-10-CM

## 2018-09-02 DIAGNOSIS — I700 Atherosclerosis of aorta: Secondary | ICD-10-CM | POA: Diagnosis present

## 2018-09-02 DIAGNOSIS — Z882 Allergy status to sulfonamides status: Secondary | ICD-10-CM

## 2018-09-02 DIAGNOSIS — S82899A Other fracture of unspecified lower leg, initial encounter for closed fracture: Secondary | ICD-10-CM

## 2018-09-02 DIAGNOSIS — Z888 Allergy status to other drugs, medicaments and biological substances status: Secondary | ICD-10-CM

## 2018-09-02 DIAGNOSIS — S82891A Other fracture of right lower leg, initial encounter for closed fracture: Secondary | ICD-10-CM

## 2018-09-02 HISTORY — DX: Other fracture of unspecified lower leg, initial encounter for closed fracture: S82.899A

## 2018-09-02 HISTORY — DX: Fracture of unspecified carpal bone, unspecified wrist, initial encounter for closed fracture: S62.109A

## 2018-09-02 HISTORY — DX: Atherosclerosis of aorta: I70.0

## 2018-09-02 HISTORY — DX: Solitary pulmonary nodule: R91.1

## 2018-09-02 LAB — COMPREHENSIVE METABOLIC PANEL
ALK PHOS: 72 U/L (ref 38–126)
ALT: 22 U/L (ref 0–44)
AST: 34 U/L (ref 15–41)
Albumin: 4.5 g/dL (ref 3.5–5.0)
Anion gap: 10 (ref 5–15)
BUN: 15 mg/dL (ref 6–20)
CALCIUM: 9 mg/dL (ref 8.9–10.3)
CO2: 24 mmol/L (ref 22–32)
CREATININE: 0.82 mg/dL (ref 0.44–1.00)
Chloride: 105 mmol/L (ref 98–111)
Glucose, Bld: 82 mg/dL (ref 70–99)
Potassium: 5.5 mmol/L — ABNORMAL HIGH (ref 3.5–5.1)
Sodium: 139 mmol/L (ref 135–145)
Total Bilirubin: 0.8 mg/dL (ref 0.3–1.2)
Total Protein: 8.3 g/dL — ABNORMAL HIGH (ref 6.5–8.1)

## 2018-09-02 LAB — URINALYSIS, ROUTINE W REFLEX MICROSCOPIC
BILIRUBIN URINE: NEGATIVE
Glucose, UA: NEGATIVE mg/dL
HGB URINE DIPSTICK: NEGATIVE
Ketones, ur: NEGATIVE mg/dL
Leukocytes, UA: NEGATIVE
Nitrite: NEGATIVE
Protein, ur: NEGATIVE mg/dL
Specific Gravity, Urine: 1.016 (ref 1.005–1.030)
pH: 5 (ref 5.0–8.0)

## 2018-09-02 LAB — CBC
HCT: 43.2 % (ref 36.0–46.0)
Hemoglobin: 13.4 g/dL (ref 12.0–15.0)
MCH: 30.7 pg (ref 26.0–34.0)
MCHC: 31 g/dL (ref 30.0–36.0)
MCV: 98.9 fL (ref 80.0–100.0)
NRBC: 0 % (ref 0.0–0.2)
PLATELETS: 297 10*3/uL (ref 150–400)
RBC: 4.37 MIL/uL (ref 3.87–5.11)
RDW: 15.7 % — AB (ref 11.5–15.5)
WBC: 8.5 10*3/uL (ref 4.0–10.5)

## 2018-09-02 LAB — PROTIME-INR
INR: 0.84
Prothrombin Time: 11.4 seconds (ref 11.4–15.2)

## 2018-09-02 LAB — ETHANOL: Alcohol, Ethyl (B): <10 mg/dL (ref <10)

## 2018-09-02 MED ORDER — KETOROLAC TROMETHAMINE 30 MG/ML IJ SOLN
30.0000 mg | Freq: Four times a day (QID) | INTRAMUSCULAR | Status: DC | PRN
  Administered 2018-09-03 – 2018-09-04 (×2): 30 mg via INTRAVENOUS
  Filled 2018-09-02 (×2): qty 1

## 2018-09-02 MED ORDER — PROPOFOL 10 MG/ML IV BOLUS
INTRAVENOUS | Status: AC
  Filled 2018-09-02: qty 20

## 2018-09-02 MED ORDER — ACETAMINOPHEN 650 MG RE SUPP: 650.0000 mg | Freq: Four times a day (QID) | RECTAL | Status: DC | PRN

## 2018-09-02 MED ORDER — KETOROLAC TROMETHAMINE 30 MG/ML IJ SOLN
30.0000 mg | Freq: Once | INTRAMUSCULAR | Status: AC
  Administered 2018-09-03: 30 mg via INTRAVENOUS
  Filled 2018-09-02: qty 1

## 2018-09-02 MED ORDER — PROPOFOL 10 MG/ML IV BOLUS
INTRAVENOUS | Status: AC | PRN
  Administered 2018-09-02: 40 mg via INTRAVENOUS

## 2018-09-02 MED ORDER — SODIUM CHLORIDE 0.9 % IV SOLN
INTRAVENOUS | Status: DC
  Administered 2018-09-03 – 2018-09-04 (×5): via INTRAVENOUS

## 2018-09-02 MED ORDER — ACETAMINOPHEN 325 MG PO TABS: 650.0000 mg | ORAL_TABLET | Freq: Four times a day (QID) | ORAL | Status: DC | PRN

## 2018-09-02 MED ORDER — ONDANSETRON HCL 4 MG/2ML IJ SOLN
4.0000 mg | Freq: Four times a day (QID) | INTRAMUSCULAR | Status: DC | PRN
  Administered 2018-09-03 – 2018-09-05 (×5): 4 mg via INTRAVENOUS
  Filled 2018-09-02 (×5): qty 2

## 2018-09-02 MED ORDER — ONDANSETRON HCL 4 MG PO TABS: 4.0000 mg | ORAL_TABLET | Freq: Four times a day (QID) | ORAL | Status: DC | PRN

## 2018-09-02 MED ORDER — HYDROCODONE-ACETAMINOPHEN 5-325 MG PO TABS
2.0000 | ORAL_TABLET | Freq: Once | ORAL | Status: AC
  Administered 2018-09-02: 1 via ORAL
  Filled 2018-09-02: qty 2

## 2018-09-02 MED ORDER — ONDANSETRON HCL 4 MG/2ML IJ SOLN
4.0000 mg | Freq: Once | INTRAMUSCULAR | Status: AC | PRN
  Administered 2018-09-02: 4 mg via INTRAVENOUS
  Filled 2018-09-02: qty 2

## 2018-09-02 MED ORDER — IOPAMIDOL (ISOVUE-300) INJECTION 61%
100.0000 mL | Freq: Once | INTRAVENOUS | Status: AC | PRN
  Administered 2018-09-02: 100 mL via INTRAVENOUS

## 2018-09-02 MED ORDER — PROPOFOL 10 MG/ML IV BOLUS
80.0000 mg | Freq: Once | INTRAVENOUS | Status: AC
  Administered 2018-09-02: 80 mg via INTRAVENOUS
  Filled 2018-09-02: qty 20

## 2018-09-02 MED ORDER — FENTANYL CITRATE (PF) 100 MCG/2ML IJ SOLN
100.0000 ug | INTRAMUSCULAR | Status: DC | PRN
  Filled 2018-09-02: qty 2

## 2018-09-02 MED ORDER — PROPOFOL 10 MG/ML IV BOLUS
80.0000 mg | Freq: Once | INTRAVENOUS | Status: AC
  Administered 2018-09-02: 80 mg via INTRAVENOUS

## 2018-09-02 MED ORDER — HEPARIN SODIUM (PORCINE) 5000 UNIT/ML IJ SOLN: 5000.0000 [IU] | Freq: Three times a day (TID) | INTRAMUSCULAR | Status: DC

## 2018-09-02 MED ORDER — HYDROMORPHONE HCL 1 MG/ML IJ SOLN: 1.0000 mg | INTRAMUSCULAR | Status: DC | PRN

## 2018-09-02 MED ORDER — FAMOTIDINE 20 MG PO TABS: 20.0000 mg | ORAL_TABLET | Freq: Two times a day (BID) | ORAL | Status: DC

## 2018-09-02 MED ORDER — HYDROCODONE-ACETAMINOPHEN 5-325 MG PO TABS
ORAL_TABLET | ORAL | Status: AC
  Administered 2018-09-02: 1
  Filled 2018-09-02: qty 1

## 2018-09-02 MED ORDER — FAMOTIDINE 20 MG PO TABS
40.0000 mg | ORAL_TABLET | Freq: Once | ORAL | Status: AC
  Administered 2018-09-02: 40 mg via ORAL
  Filled 2018-09-02: qty 2

## 2018-09-02 NOTE — ED Notes (Signed)
[facility] at bedside.  
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
## 2018-09-02 NOTE — ED Notes (Signed)
EDP at bedside  
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
## 2018-09-02 NOTE — ED Notes (Signed)
Xray at bedside. Pt refusing pain medicine at this time. She states, "it makes me very sick." Reminded her that we had it ready if she were to change her mind.
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
## 2018-09-03 ENCOUNTER — Observation Stay (HOSPITAL_COMMUNITY): Payer: No Typology Code available for payment source | Admitting: Anesthesiology

## 2018-09-03 ENCOUNTER — Encounter (HOSPITAL_COMMUNITY): Payer: Self-pay

## 2018-09-03 ENCOUNTER — Encounter (HOSPITAL_COMMUNITY)
Admission: EM | Disposition: A | Discharge status: Home Health | Payer: Self-pay | Source: Home / Self Care | Attending: Family Medicine

## 2018-09-03 DIAGNOSIS — I700 Atherosclerosis of aorta: Secondary | ICD-10-CM | POA: Diagnosis present

## 2018-09-03 DIAGNOSIS — R911 Solitary pulmonary nodule: Secondary | ICD-10-CM

## 2018-09-03 DIAGNOSIS — Z888 Allergy status to other drugs, medicaments and biological substances status: Secondary | ICD-10-CM | POA: Diagnosis not present

## 2018-09-03 DIAGNOSIS — Z9621 Cochlear implant status: Secondary | ICD-10-CM | POA: Diagnosis present

## 2018-09-03 DIAGNOSIS — K0889 Other specified disorders of teeth and supporting structures: Secondary | ICD-10-CM | POA: Diagnosis present

## 2018-09-03 DIAGNOSIS — S82841A Displaced bimalleolar fracture of right lower leg, initial encounter for closed fracture: Secondary | ICD-10-CM | POA: Diagnosis present

## 2018-09-03 DIAGNOSIS — Z8249 Family history of ischemic heart disease and other diseases of the circulatory system: Secondary | ICD-10-CM | POA: Diagnosis not present

## 2018-09-03 DIAGNOSIS — Z9089 Acquired absence of other organs: Secondary | ICD-10-CM | POA: Diagnosis not present

## 2018-09-03 DIAGNOSIS — Z6841 Body Mass Index (BMI) 40.0 and over, adult: Secondary | ICD-10-CM | POA: Diagnosis not present

## 2018-09-03 DIAGNOSIS — Z882 Allergy status to sulfonamides status: Secondary | ICD-10-CM | POA: Diagnosis not present

## 2018-09-03 DIAGNOSIS — L89151 Pressure ulcer of sacral region, stage 1: Secondary | ICD-10-CM | POA: Diagnosis present

## 2018-09-03 DIAGNOSIS — I10 Essential (primary) hypertension: Secondary | ICD-10-CM | POA: Diagnosis present

## 2018-09-03 DIAGNOSIS — Z87891 Personal history of nicotine dependence: Secondary | ICD-10-CM | POA: Diagnosis not present

## 2018-09-03 DIAGNOSIS — M25571 Pain in right ankle and joints of right foot: Secondary | ICD-10-CM | POA: Diagnosis present

## 2018-09-03 DIAGNOSIS — Z881 Allergy status to other antibiotic agents status: Secondary | ICD-10-CM | POA: Diagnosis not present

## 2018-09-03 DIAGNOSIS — Y92411 Interstate highway as the place of occurrence of the external cause: Secondary | ICD-10-CM | POA: Diagnosis not present

## 2018-09-03 DIAGNOSIS — E875 Hyperkalemia: Secondary | ICD-10-CM | POA: Diagnosis present

## 2018-09-03 DIAGNOSIS — S52571A Other intraarticular fracture of lower end of right radius, initial encounter for closed fracture: Secondary | ICD-10-CM | POA: Diagnosis present

## 2018-09-03 DIAGNOSIS — S301XXA Contusion of abdominal wall, initial encounter: Secondary | ICD-10-CM | POA: Diagnosis present

## 2018-09-03 HISTORY — PX: CAST APPLICATION: SHX380

## 2018-09-03 HISTORY — PX: ORIF WRIST FRACTURE: SHX2133

## 2018-09-03 LAB — CBC
HCT: 35.4 % — ABNORMAL LOW (ref 36.0–46.0)
Hemoglobin: 10.9 g/dL — ABNORMAL LOW (ref 12.0–15.0)
MCH: 30.3 pg (ref 26.0–34.0)
MCHC: 30.8 g/dL (ref 30.0–36.0)
MCV: 98.3 fL (ref 80.0–100.0)
PLATELETS: 230 10*3/uL (ref 150–400)
RBC: 3.6 MIL/uL — ABNORMAL LOW (ref 3.87–5.11)
RDW: 15.5 % (ref 11.5–15.5)
WBC: 8.7 10*3/uL (ref 4.0–10.5)
nRBC: 0 % (ref 0.0–0.2)

## 2018-09-03 LAB — CREATININE, SERUM
Creatinine, Ser: 0.91 mg/dL (ref 0.44–1.00)
GFR calc Af Amer: >60 mL/min (ref >60)
GFR calc non Af Amer: >60 mL/min (ref >60)

## 2018-09-03 LAB — MRSA PCR SCREENING: MRSA by PCR: NEGATIVE

## 2018-09-03 SURGERY — OPEN REDUCTION INTERNAL FIXATION (ORIF) WRIST FRACTURE
Anesthesia: General | Laterality: Right

## 2018-09-03 MED ORDER — ACETAMINOPHEN 325 MG PO TABS: 325.0000 mg | ORAL_TABLET | Freq: Four times a day (QID) | ORAL | Status: DC | PRN

## 2018-09-03 MED ORDER — MIDAZOLAM HCL 2 MG/2ML IJ SOLN
INTRAMUSCULAR | Status: DC | PRN
  Administered 2018-09-03: 2 mg via INTRAVENOUS

## 2018-09-03 MED ORDER — BUPIVACAINE HCL (PF) 0.25 % IJ SOLN
INTRAMUSCULAR | Status: DC | PRN
  Administered 2018-09-03: 10 mL

## 2018-09-03 MED ORDER — GABAPENTIN 300 MG PO CAPS
ORAL_CAPSULE | ORAL | Status: AC
  Administered 2018-09-03: 300 mg via ORAL
  Filled 2018-09-03: qty 1

## 2018-09-03 MED ORDER — CEFAZOLIN SODIUM-DEXTROSE 2-4 GM/100ML-% IV SOLN
INTRAVENOUS | Status: AC
  Filled 2018-09-03: qty 100

## 2018-09-03 MED ORDER — GABAPENTIN 300 MG PO CAPS
300.0000 mg | ORAL_CAPSULE | Freq: Once | ORAL | Status: AC
  Administered 2018-09-03: 300 mg via ORAL

## 2018-09-03 MED ORDER — ROCURONIUM BROMIDE 50 MG/5ML IV SOSY
PREFILLED_SYRINGE | INTRAVENOUS | Status: DC | PRN
  Administered 2018-09-03: 50 mg via INTRAVENOUS

## 2018-09-03 MED ORDER — CEFAZOLIN SODIUM-DEXTROSE 2-4 GM/100ML-% IV SOLN
2.0000 g | INTRAVENOUS | Status: AC
  Administered 2018-09-03: 2 g via INTRAVENOUS

## 2018-09-03 MED ORDER — ROCURONIUM BROMIDE 50 MG/5ML IV SOSY
PREFILLED_SYRINGE | INTRAVENOUS | Status: AC
  Filled 2018-09-03: qty 5

## 2018-09-03 MED ORDER — ONDANSETRON HCL 4 MG/2ML IJ SOLN: 4.0000 mg | Freq: Four times a day (QID) | INTRAMUSCULAR | Status: DC | PRN

## 2018-09-03 MED ORDER — FENTANYL CITRATE (PF) 100 MCG/2ML IJ SOLN: 25.0000 ug | INTRAMUSCULAR | Status: DC | PRN

## 2018-09-03 MED ORDER — BUPIVACAINE-EPINEPHRINE (PF) 0.5% -1:200000 IJ SOLN
INTRAMUSCULAR | Status: DC | PRN
  Administered 2018-09-03: 30 mL via PERINEURAL

## 2018-09-03 MED ORDER — KETOROLAC TROMETHAMINE 15 MG/ML IJ SOLN
15.0000 mg | Freq: Four times a day (QID) | INTRAMUSCULAR | Status: AC
  Administered 2018-09-03 – 2018-09-04 (×4): 15 mg via INTRAVENOUS
  Filled 2018-09-03 (×4): qty 1

## 2018-09-03 MED ORDER — POLYETHYLENE GLYCOL 3350 17 G PO PACK: 17.0000 g | PACK | Freq: Every day | ORAL | Status: DC | PRN

## 2018-09-03 MED ORDER — 0.9 % SODIUM CHLORIDE (POUR BTL) OPTIME
TOPICAL | Status: DC | PRN
  Administered 2018-09-03: 1000 mL

## 2018-09-03 MED ORDER — LORATADINE 10 MG PO TABS
10.0000 mg | ORAL_TABLET | Freq: Every day | ORAL | Status: DC | PRN
  Administered 2018-09-04: 10 mg via ORAL
  Filled 2018-09-03 (×2): qty 1

## 2018-09-03 MED ORDER — PANTOPRAZOLE SODIUM 40 MG PO TBEC
40.0000 mg | DELAYED_RELEASE_TABLET | Freq: Every day | ORAL | Status: DC
  Administered 2018-09-03 – 2018-09-05 (×3): 40 mg via ORAL
  Filled 2018-09-03 (×3): qty 1

## 2018-09-03 MED ORDER — METOCLOPRAMIDE HCL 5 MG/ML IJ SOLN: 5.0000 mg | Freq: Three times a day (TID) | INTRAMUSCULAR | Status: DC | PRN

## 2018-09-03 MED ORDER — HYDROMORPHONE HCL 1 MG/ML IJ SOLN: 0.2500 mg | INTRAMUSCULAR | Status: DC | PRN

## 2018-09-03 MED ORDER — METOCLOPRAMIDE HCL 5 MG PO TABS: 5.0000 mg | ORAL_TABLET | Freq: Three times a day (TID) | ORAL | Status: DC | PRN

## 2018-09-03 MED ORDER — METHOCARBAMOL 500 MG PO TABS
500.0000 mg | ORAL_TABLET | Freq: Four times a day (QID) | ORAL | Status: DC | PRN
  Filled 2018-09-03: qty 1

## 2018-09-03 MED ORDER — GABAPENTIN 300 MG PO CAPS: 300.0000 mg | ORAL_CAPSULE | Freq: Three times a day (TID) | ORAL | Status: DC | PRN

## 2018-09-03 MED ORDER — HYDROCODONE-ACETAMINOPHEN 5-325 MG PO TABS
1.0000 | ORAL_TABLET | ORAL | Status: DC | PRN
  Administered 2018-09-03: 1 via ORAL
  Filled 2018-09-03: qty 1

## 2018-09-03 MED ORDER — ENOXAPARIN SODIUM 40 MG/0.4ML ~~LOC~~ SOLN
40.0000 mg | SUBCUTANEOUS | Status: DC
  Administered 2018-09-04 – 2018-09-05 (×2): 40 mg via SUBCUTANEOUS
  Filled 2018-09-03 (×2): qty 0.4

## 2018-09-03 MED ORDER — LACTATED RINGERS IV SOLN: INTRAVENOUS | Status: DC

## 2018-09-03 MED ORDER — EPHEDRINE SULFATE-NACL 50-0.9 MG/10ML-% IV SOSY
PREFILLED_SYRINGE | INTRAVENOUS | Status: DC | PRN
  Administered 2018-09-03: 15 mg via INTRAVENOUS
  Administered 2018-09-03 (×2): 10 mg via INTRAVENOUS
  Administered 2018-09-03: 15 mg via INTRAVENOUS

## 2018-09-03 MED ORDER — METHOCARBAMOL 1000 MG/10ML IJ SOLN
500.0000 mg | Freq: Four times a day (QID) | INTRAVENOUS | Status: DC | PRN
  Filled 2018-09-03: qty 5

## 2018-09-03 MED ORDER — BUPIVACAINE HCL (PF) 0.25 % IJ SOLN
INTRAMUSCULAR | Status: AC
  Filled 2018-09-03: qty 30

## 2018-09-03 MED ORDER — ONDANSETRON HCL 4 MG PO TABS: 4.0000 mg | ORAL_TABLET | Freq: Four times a day (QID) | ORAL | Status: DC | PRN

## 2018-09-03 MED ORDER — KETAMINE HCL 50 MG/5ML IJ SOSY
PREFILLED_SYRINGE | INTRAMUSCULAR | Status: AC
  Filled 2018-09-03: qty 5

## 2018-09-03 MED ORDER — ONDANSETRON HCL 4 MG PO TABS: 4.0000 mg | ORAL_TABLET | Freq: Three times a day (TID) | ORAL | 0 refills | Status: DC | PRN

## 2018-09-03 MED ORDER — FENTANYL CITRATE (PF) 100 MCG/2ML IJ SOLN
INTRAMUSCULAR | Status: DC | PRN
  Administered 2018-09-03: 50 ug via INTRAVENOUS

## 2018-09-03 MED ORDER — PROPOFOL 10 MG/ML IV BOLUS
INTRAVENOUS | Status: DC | PRN
  Administered 2018-09-03: 200 mg via INTRAVENOUS

## 2018-09-03 MED ORDER — LACTATED RINGERS IV SOLN
INTRAVENOUS | Status: DC
  Administered 2018-09-03: 14:00:00 via INTRAVENOUS

## 2018-09-03 MED ORDER — PROPOFOL 1000 MG/100ML IV EMUL
INTRAVENOUS | Status: AC
  Filled 2018-09-03: qty 100

## 2018-09-03 MED ORDER — ONDANSETRON HCL 4 MG/2ML IJ SOLN
INTRAMUSCULAR | Status: DC | PRN
  Administered 2018-09-03: 4 mg via INTRAVENOUS

## 2018-09-03 MED ORDER — PROPOFOL 10 MG/ML IV BOLUS
INTRAVENOUS | Status: AC
  Filled 2018-09-03: qty 20

## 2018-09-03 MED ORDER — ACETAMINOPHEN 500 MG PO TABS
1000.0000 mg | ORAL_TABLET | Freq: Once | ORAL | Status: AC
  Administered 2018-09-03: 1000 mg via ORAL

## 2018-09-03 MED ORDER — FENTANYL CITRATE (PF) 250 MCG/5ML IJ SOLN
INTRAMUSCULAR | Status: AC
  Filled 2018-09-03: qty 5

## 2018-09-03 MED ORDER — BUPIVACAINE HCL (PF) 0.5 % IJ SOLN
INTRAMUSCULAR | Status: DC | PRN
  Administered 2018-09-03: 10 mL

## 2018-09-03 MED ORDER — DEXAMETHASONE SODIUM PHOSPHATE 10 MG/ML IJ SOLN
INTRAMUSCULAR | Status: DC | PRN
  Administered 2018-09-03: 10 mg via INTRAVENOUS

## 2018-09-03 MED ORDER — FENTANYL CITRATE (PF) 100 MCG/2ML IJ SOLN
INTRAMUSCULAR | Status: AC
  Filled 2018-09-03: qty 2

## 2018-09-03 MED ORDER — MIDAZOLAM HCL 2 MG/2ML IJ SOLN
2.0000 mg | Freq: Once | INTRAMUSCULAR | Status: AC
  Administered 2018-09-03: 2 mg via INTRAVENOUS

## 2018-09-03 MED ORDER — SCOPOLAMINE 1 MG/3DAYS TD PT72
MEDICATED_PATCH | TRANSDERMAL | Status: AC
  Administered 2018-09-03: 1.5 mg via TRANSDERMAL
  Filled 2018-09-03: qty 1

## 2018-09-03 MED ORDER — MIDAZOLAM HCL 2 MG/2ML IJ SOLN
INTRAMUSCULAR | Status: AC
  Filled 2018-09-03: qty 2

## 2018-09-03 MED ORDER — ONDANSETRON HCL 4 MG/2ML IJ SOLN
INTRAMUSCULAR | Status: AC
  Filled 2018-09-03: qty 2

## 2018-09-03 MED ORDER — EPHEDRINE 5 MG/ML INJ
INTRAVENOUS | Status: AC
  Filled 2018-09-03: qty 10

## 2018-09-03 MED ORDER — MIDAZOLAM HCL 2 MG/2ML IJ SOLN
INTRAMUSCULAR | Status: AC
  Administered 2018-09-03: 2 mg via INTRAVENOUS
  Filled 2018-09-03: qty 2

## 2018-09-03 MED ORDER — SUGAMMADEX SODIUM 200 MG/2ML IV SOLN
INTRAVENOUS | Status: DC | PRN
  Administered 2018-09-03: 200 mg via INTRAVENOUS

## 2018-09-03 MED ORDER — DOCUSATE SODIUM 100 MG PO CAPS
100.0000 mg | ORAL_CAPSULE | Freq: Two times a day (BID) | ORAL | Status: DC
  Administered 2018-09-03 – 2018-09-05 (×2): 100 mg via ORAL
  Filled 2018-09-03 (×4): qty 1

## 2018-09-03 MED ORDER — HYDROCODONE-ACETAMINOPHEN 5-325 MG PO TABS
1.0000 | ORAL_TABLET | ORAL | Status: DC | PRN
  Administered 2018-09-05: 2 via ORAL
  Filled 2018-09-03: qty 2

## 2018-09-03 MED ORDER — HYDROCODONE-ACETAMINOPHEN 5-325 MG PO TABS: 1.0000 | ORAL_TABLET | Freq: Four times a day (QID) | ORAL | 0 refills | Status: DC | PRN

## 2018-09-03 MED ORDER — CHLORHEXIDINE GLUCONATE 4 % EX LIQD: 60.0000 mL | Freq: Once | CUTANEOUS | Status: DC

## 2018-09-03 MED ORDER — ACETAMINOPHEN 500 MG PO TABS
ORAL_TABLET | ORAL | Status: AC
  Administered 2018-09-03: 1000 mg via ORAL
  Filled 2018-09-03: qty 2

## 2018-09-03 MED ORDER — ATENOLOL 50 MG PO TABS
25.0000 mg | ORAL_TABLET | Freq: Every day | ORAL | Status: DC
  Administered 2018-09-03 – 2018-09-05 (×3): 25 mg via ORAL
  Filled 2018-09-03 (×3): qty 1

## 2018-09-03 MED ORDER — LIDOCAINE 2% (20 MG/ML) 5 ML SYRINGE
INTRAMUSCULAR | Status: AC
  Filled 2018-09-03: qty 5

## 2018-09-03 MED ORDER — DIPHENHYDRAMINE HCL 50 MG/ML IJ SOLN
INTRAMUSCULAR | Status: AC
  Filled 2018-09-03: qty 1

## 2018-09-03 MED ORDER — DEXAMETHASONE SODIUM PHOSPHATE 10 MG/ML IJ SOLN
INTRAMUSCULAR | Status: AC
  Filled 2018-09-03: qty 1

## 2018-09-03 MED ORDER — ASPIRIN EC 81 MG PO TBEC: 81.0000 mg | DELAYED_RELEASE_TABLET | Freq: Two times a day (BID) | ORAL | 0 refills | Status: DC

## 2018-09-03 MED ORDER — SCOPOLAMINE 1 MG/3DAYS TD PT72
1.0000 | MEDICATED_PATCH | TRANSDERMAL | Status: DC
  Administered 2018-09-03: 1.5 mg via TRANSDERMAL

## 2018-09-03 MED ORDER — KETAMINE HCL 10 MG/ML IJ SOLN
INTRAMUSCULAR | Status: DC | PRN
  Administered 2018-09-03: 100 mg via INTRAVENOUS

## 2018-09-03 MED ORDER — PROPOFOL 500 MG/50ML IV EMUL
INTRAVENOUS | Status: DC | PRN
  Administered 2018-09-03: 50 ug/kg/min via INTRAVENOUS

## 2018-09-03 SURGICAL SUPPLY — 87 items
APL SKNCLS STERI-STRIP NONHPOA (GAUZE/BANDAGES/DRESSINGS) ×1
BANDAGE ACE 3X5.8 VEL STRL LF (GAUZE/BANDAGES/DRESSINGS) ×2 IMPLANT
BANDAGE ACE 4X5 VEL STRL LF (GAUZE/BANDAGES/DRESSINGS) ×4 IMPLANT
BANDAGE ESMARK 6X9 LF (GAUZE/BANDAGES/DRESSINGS) ×1 IMPLANT
BENZOIN TINCTURE PRP APPL 2/3 (GAUZE/BANDAGES/DRESSINGS) ×2 IMPLANT
BIT DRILL 2.2 SS TIBIAL (BIT) ×1 IMPLANT
BLADE CLIPPER SURG (BLADE) IMPLANT
BNDG CMPR 9X4 STRL LF SNTH (GAUZE/BANDAGES/DRESSINGS) ×1
BNDG CMPR 9X6 STRL LF SNTH (GAUZE/BANDAGES/DRESSINGS) ×1
BNDG COHESIVE 4X5 TAN STRL (GAUZE/BANDAGES/DRESSINGS) ×2 IMPLANT
BNDG ESMARK 4X9 LF (GAUZE/BANDAGES/DRESSINGS) ×2 IMPLANT
BNDG ESMARK 6X9 LF (GAUZE/BANDAGES/DRESSINGS) ×2
CANISTER SUCT 3000ML PPV (MISCELLANEOUS) ×2 IMPLANT
CORDS BIPOLAR (ELECTRODE) ×2 IMPLANT
COVER SURGICAL LIGHT HANDLE (MISCELLANEOUS) ×4 IMPLANT
COVER WAND RF STERILE (DRAPES) ×2 IMPLANT
CUFF TOURNIQUET SINGLE 18IN (TOURNIQUET CUFF) ×2 IMPLANT
CUFF TOURNIQUET SINGLE 24IN (TOURNIQUET CUFF) IMPLANT
CUFF TOURNIQUET SINGLE 34IN LL (TOURNIQUET CUFF) IMPLANT
DECANTER SPIKE VIAL GLASS SM (MISCELLANEOUS) IMPLANT
DRAPE C-ARM 42X72 X-RAY (DRAPES) IMPLANT
DRAPE C-ARMOR (DRAPES) IMPLANT
DRAPE OEC MINIVIEW 54X84 (DRAPES) ×2 IMPLANT
DRAPE ORTHO SPLIT 77X108 STRL (DRAPES)
DRAPE SURG ORHT 6 SPLT 77X108 (DRAPES) IMPLANT
DRAPE U-SHAPE 47X51 STRL (DRAPES) ×2 IMPLANT
DRSG ADAPTIC 3X8 NADH LF (GAUZE/BANDAGES/DRESSINGS) ×2 IMPLANT
DRSG PAD ABDOMINAL 8X10 ST (GAUZE/BANDAGES/DRESSINGS) ×4 IMPLANT
DURAPREP 26ML APPLICATOR (WOUND CARE) ×2 IMPLANT
ELECT REM PT RETURN 9FT ADLT (ELECTROSURGICAL) ×2
ELECTRODE REM PT RTRN 9FT ADLT (ELECTROSURGICAL) ×1 IMPLANT
GAUZE SPONGE 4X4 12PLY STRL (GAUZE/BANDAGES/DRESSINGS) ×2 IMPLANT
GAUZE XEROFORM 1X8 LF (GAUZE/BANDAGES/DRESSINGS) ×2 IMPLANT
GLOVE BIO SURGEON STRL SZ7.5 (GLOVE) ×4 IMPLANT
GLOVE BIO SURGEON STRL SZ8 (GLOVE) ×2 IMPLANT
GLOVE BIOGEL PI IND STRL 8 (GLOVE) ×2 IMPLANT
GLOVE BIOGEL PI INDICATOR 8 (GLOVE) ×2
GOWN STRL REUS W/ TWL LRG LVL3 (GOWN DISPOSABLE) ×2 IMPLANT
GOWN STRL REUS W/ TWL XL LVL3 (GOWN DISPOSABLE) ×1 IMPLANT
GOWN STRL REUS W/TWL LRG LVL3 (GOWN DISPOSABLE) ×4
GOWN STRL REUS W/TWL XL LVL3 (GOWN DISPOSABLE) ×2
K-WIRE 1.6 (WIRE) ×4
K-WIRE FX5X1.6XNS BN SS (WIRE) ×2
KIT BASIN OR (CUSTOM PROCEDURE TRAY) ×2 IMPLANT
KIT TURNOVER KIT B (KITS) ×2 IMPLANT
KWIRE FX5X1.6XNS BN SS (WIRE) IMPLANT
MANIFOLD NEPTUNE II (INSTRUMENTS) ×2 IMPLANT
NDL HYPO 25GX1X1/2 BEV (NEEDLE) IMPLANT
NEEDLE 22X1 1/2 (OR ONLY) (NEEDLE) ×2 IMPLANT
NEEDLE HYPO 25GX1X1/2 BEV (NEEDLE) IMPLANT
NS IRRIG 1000ML POUR BTL (IV SOLUTION) ×4 IMPLANT
PACK ORTHO EXTREMITY (CUSTOM PROCEDURE TRAY) ×2 IMPLANT
PAD ARMBOARD 7.5X6 YLW CONV (MISCELLANEOUS) ×4 IMPLANT
PAD CAST 4YDX4 CTTN HI CHSV (CAST SUPPLIES) ×2 IMPLANT
PADDING CAST COTTON 4X4 STRL (CAST SUPPLIES) ×2
PADDING CAST COTTON 6X4 STRL (CAST SUPPLIES) ×1 IMPLANT
PEG LOCKING SMOOTH 2.2X18 (Peg) ×4 IMPLANT
PEG LOCKING SMOOTH 2.2X20 (Screw) ×1 IMPLANT
PLATE DVR VOLAR RIM NARROW RT (Plate) ×1 IMPLANT
SCREW LOCK 14X2.7X 3 LD TPR (Screw) IMPLANT
SCREW LOCKING 2.7X13MM (Screw) ×1 IMPLANT
SCREW LOCKING 2.7X14 (Screw) ×2 IMPLANT
SCREW LOCKING 2.7X15MM (Screw) ×1 IMPLANT
SLING ARM FOAM STRAP MED (SOFTGOODS) ×1 IMPLANT
SPLINT PLASTER CAST XFAST 5X30 (CAST SUPPLIES) IMPLANT
SPLINT PLASTER XFAST SET 5X30 (CAST SUPPLIES) ×1
SPONGE LAP 18X18 X RAY DECT (DISPOSABLE) ×2 IMPLANT
SPONGE LAP 4X18 RFD (DISPOSABLE) ×2 IMPLANT
STRIP CLOSURE SKIN 1/2X4 (GAUZE/BANDAGES/DRESSINGS) ×2 IMPLANT
SUCTION FRAZIER HANDLE 10FR (MISCELLANEOUS) ×1
SUCTION TUBE FRAZIER 10FR DISP (MISCELLANEOUS) ×1 IMPLANT
SUT ETHILON 3 0 PS 1 (SUTURE) ×1 IMPLANT
SUT MNCRL AB 4-0 PS2 18 (SUTURE) ×2 IMPLANT
SUT MON AB 2-0 CT1 27 (SUTURE) IMPLANT
SUT VIC AB 0 CT1 27 (SUTURE)
SUT VIC AB 0 CT1 27XBRD ANBCTR (SUTURE) IMPLANT
SUT VIC AB 2-0 CT1 27 (SUTURE) ×2
SUT VIC AB 2-0 CT1 TAPERPNT 27 (SUTURE) IMPLANT
SYR BULB IRRIGATION 50ML (SYRINGE) ×2 IMPLANT
SYR CONTROL 10ML LL (SYRINGE) IMPLANT
TOWEL OR 17X24 6PK STRL BLUE (TOWEL DISPOSABLE) ×2 IMPLANT
TOWEL OR 17X26 10 PK STRL BLUE (TOWEL DISPOSABLE) ×2 IMPLANT
TOWEL OR NON WOVEN STRL DISP B (DISPOSABLE) ×2 IMPLANT
TUBE CONNECTING 12X1/4 (SUCTIONS) ×2 IMPLANT
UNDERPAD 30X30 (UNDERPADS AND DIAPERS) ×4 IMPLANT
WATER STERILE IRR 1000ML POUR (IV SOLUTION) ×4 IMPLANT
YANKAUER SUCT BULB TIP NO VENT (SUCTIONS) IMPLANT

## 2018-09-03 NOTE — Plan of Care (Signed)
  Problem: Pain Managment: Goal: General experience of comfort will improve Outcome: Progressing   

## 2018-09-03 NOTE — Anesthesia Procedure Notes (Signed)
Procedure Name: Intubation Date/Time: 09/03/2018 2:40 PM Performed by: Barrington Ellison, CRNA Pre-anesthesia Checklist: Patient identified, Emergency Drugs available, Suction available and Patient being monitored Patient Re-evaluated:Patient Re-evaluated prior to induction Oxygen Delivery Method: Circle System Utilized Preoxygenation: Pre-oxygenation with 100% oxygen Induction Type: IV induction Ventilation: Mask ventilation without difficulty Laryngoscope Size: Mac and 3 Grade View: Grade I Tube type: Oral Tube size: 7.0 mm Number of attempts: 1 Airway Equipment and Method: Stylet and Oral airway Placement Confirmation: ETT inserted through vocal cords under direct vision,  positive ETCO2 and breath sounds checked- equal and bilateral Secured at: 20 cm Tube secured with: Tape Dental Injury: Teeth and Oropharynx as per pre-operative assessment

## 2018-09-03 NOTE — Plan of Care (Signed)
  Problem: Pain Managment: Goal: General experience of comfort will improve Outcome: Progressing   Problem: Safety: Goal: Ability to remain free from injury will improve Outcome: Progressing   Problem: Skin Integrity: Goal: Risk for impaired skin integrity will decrease Outcome: Progressing   

## 2018-09-03 NOTE — Anesthesia Procedure Notes (Signed)
Anesthesia Regional Block: Popliteal block   Pre-Anesthetic Checklist: ,, timeout performed, Correct Patient, Correct Site, Correct Laterality, Correct Procedure, Correct Position, site marked, Risks and benefits discussed, pre-op evaluation,  At surgeon's request and post-op pain management  Laterality: Right  Prep: Maximum Sterile Barrier Precautions used, chloraprep       Needles:  Injection technique: Single-shot  Needle Type: Echogenic Stimulator Needle     Needle Length: 9cm  Needle Gauge: 21     Additional Needles:   Procedures:, nerve stimulator,,, ultrasound used (permanent image in chart),,,,  Narrative:  Start time: 09/03/2018 1:50 PM End time: 09/03/2018 2:05 PM Injection made incrementally with aspirations every 5 mL. Anesthesiologist: Gaynelle Adu, MD  Additional Notes: 2% Lidocaine skin wheel. Saphenous block with 10cc of 0.5% Bupivicaine plain.

## 2018-09-03 NOTE — Progress Notes (Signed)
Orthopedic Progress Note: The patient underwent successful ORIF right wrist this afternoon.   Her bimalleolar ankle fracture was also re-reduced and splinted.  Due to significant swelling, ORIF of her ankle was not performed.  This has been scheduled for next Tuesday, 09/11/2018 at [facility] [place].   The patient may discharge home after she mobilizes with therapy and has platform walker.  Nonweightbearing RLE and RUE.  She may bear weight through her right elbow while utilizing platform walker.    Elevate injured extremities at all times.  Lovenox daily while inpatient.  Start ASA 81 mg twice daily after discharge but stop 5 days prior to ankle surgery on 09/06/2018.  This will resume after ankle ORIF.  Prescriptions have been sent to the patient's pharmacy.  Albina Billet III, PA-C 09/03/2018 4:33 PM

## 2018-09-03 NOTE — Anesthesia Postprocedure Evaluation (Signed)
Anesthesia Post Note  Patient: PHYLLYS PSENCIK  Procedure(s) Performed: OPEN REDUCTION INTERNAL FIXATION (ORIF) WRIST FRACTURE (Right ) CAST APPLICATION -RIGHT ANKLE (Right )     Patient location during evaluation: PACU Anesthesia Type: General and Regional Level of consciousness: awake and alert Pain management: pain level controlled Vital Signs Assessment: post-procedure vital signs reviewed and stable Respiratory status: spontaneous breathing, nonlabored ventilation, respiratory function stable and patient connected to nasal cannula oxygen Cardiovascular status: blood pressure returned to baseline and stable Postop Assessment: no apparent nausea or vomiting Anesthetic complications: no    Last Vitals:  Vitals:   09/03/18 1642 09/03/18 1645  BP: (!) 96/55 117/68  Pulse: 84 83  Resp: 19 18  Temp:  36.5 C  SpO2: 97% 97%    Last Pain:  Vitals:   09/03/18 1642  TempSrc:   PainSc: 0-No pain                 Jolan Mealor,W. EDMOND
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
## 2018-09-03 NOTE — Op Note (Signed)
09/02/2018 - 09/03/2018  3:27 PM  PATIENT:  [name]    PRE-OPERATIVE DIAGNOSIS:  Right wrist and ankle fracture  POST-OPERATIVE DIAGNOSIS:  Same  PROCEDURE:  OPEN REDUCTION INTERNAL FIXATION (ORIF) WRIST FRACTURE, CAST APPLICATION -RIGHT ANKLE  SURGEON:  Sheral Apley, MD  ASSISTANT: Aquilla Hacker, PA-C, he was present and scrubbed throughout the case, critical for completion in a timely fashion, and for retraction, instrumentation, and closure.   ANESTHESIA:   gen  PREOPERATIVE INDICATIONS:  [name] is a  53 y.o. female with a diagnosis of Right wrist and ankle fracture who failed conservative measures and elected for surgical management.    The risks benefits and alternatives were discussed with the patient preoperatively including but not limited to the risks of infection, bleeding, nerve injury, cardiopulmonary complications, the need for revision surgery, among others, and the patient was willing to proceed.  OPERATIVE IMPLANTS: DVR plate  OPERATIVE FINDINGS: significant R ankle swelling and some blistering  BLOOD LOSS: min  COMPLICATIONS: none  TOURNIQUET TIME:  OPERATIVE PROCEDURE:  Patient was identified in the preoperative holding area and site was marked by me She was transported to the operating theater and placed on the table in supine position taking care to pad all bony prominences. After a preincinduction time out anesthesia was induced. The right upper extremity was prepped and draped in normal sterile fashion and a pre-incision timeout was performed. She received ancef for preoperative antibiotics.   I made a 5 cm incision centered over her FCR tendon and dissected down carefully to the level of the flexor tendon sheath and incise this longitudinally and retracted the FCR radially and incised the dorsal aspect of the sheath.   I bluntly dissected the FPL muscle belly away from the brachioradialis and then sharply incised the pronator  tendon from the distal radius and from the wrist capsule. I Elevated this off the bone the fractures visible.   I released the brachioradialis from its insertion. I then debrided the fracture and performed a manual reduction.   I selected a plate and I placed it on the bone. I pinned it into place and was happy on multiple radiographic views with it's placement. I first placed a shaft screw to stabilize the volar barton component.  I then fixed the plate distally with the locking pegs. I confirmed no articular penetration with the pegs and that none were prominent dorsally.   I was happy with the final fluoro xrays    I thoroughly irrigated the wound and closed the pronator over top of the plate and then closed the skin in layers with absorbable stitch. Sterile dressing was applied   Next I turned my attention to the R ankle. I performed a closed reduction and confirmed this under fluoro. A short leg splint was placed.   POST OPERATIVE PLAN: NWB, Splint full time. Ambulate and ASA for DVT px.

## 2018-09-03 NOTE — Transfer of Care (Signed)
Immediate Anesthesia Transfer of Care Note  Patient: [name]  Procedure(s) Performed: OPEN REDUCTION INTERNAL FIXATION (ORIF) WRIST FRACTURE (Right ) CAST APPLICATION -RIGHT ANKLE (Right )  Patient Location: PACU  Anesthesia Type:General  Level of Consciousness: lethargic and responds to stimulation  Airway & Oxygen Therapy: Patient Spontanous Breathing and Patient connected to nasal cannula oxygen  Post-op Assessment: Report given to RN  Post vital signs: Reviewed and stable  Last Vitals:  Vitals Value Taken Time  BP 88/55 09/03/2018  4:12 PM  Temp    Pulse 91 09/03/2018  4:12 PM  Resp 19 09/03/2018  4:12 PM  SpO2 87 % 09/03/2018  4:12 PM  Vitals shown include unvalidated device data.  Last Pain:  Vitals:   09/03/18 1030  TempSrc:   PainSc: 4          Complications: No apparent anesthesia complications

## 2018-09-03 NOTE — Anesthesia Preprocedure Evaluation (Addendum)
Anesthesia Evaluation  Patient identified by MRN, date of birth, ID band Patient awake    Reviewed: Allergy & Precautions, H&P , NPO status , Patient's Chart, lab work & pertinent test results, reviewed documented beta blocker date and time   Airway Mallampati: II  TM Distance: >3 FB Neck ROM: Full    Dental no notable dental hx. (+) Poor Dentition, Dental Advisory Given   Pulmonary neg pulmonary ROS, former smoker,    Pulmonary exam normal breath sounds clear to auscultation       Cardiovascular hypertension, Pt. on medications and Pt. on home beta blockers  Rhythm:Regular Rate:Normal     Neuro/Psych negative neurological ROS  negative psych ROS   GI/Hepatic negative GI ROS, Neg liver ROS,   Endo/Other  Morbid obesity  Renal/GU negative Renal ROS  negative genitourinary   Musculoskeletal   Abdominal   Peds  Hematology negative hematology ROS (+)   Anesthesia Other Findings   Reproductive/Obstetrics negative OB ROS                            Anesthesia Physical Anesthesia Plan  ASA: III  Anesthesia Plan: General   Post-op Pain Management:  Regional for Post-op pain   Induction: Intravenous  PONV Risk Score and Plan: 4 or greater and Ondansetron, Dexamethasone and Midazolam  Airway Management Planned: Oral ETT  Additional Equipment:   Intra-op Plan:   Post-operative Plan: Extubation in OR  Informed Consent: I have reviewed the patients History and Physical, chart, labs and discussed the procedure including the risks, benefits and alternatives for the proposed anesthesia with the patient or authorized representative who has indicated his/her understanding and acceptance.     Dental advisory given  Plan Discussed with: CRNA  Anesthesia Plan Comments:        Anesthesia Quick Evaluation

## 2018-09-04 ENCOUNTER — Encounter (HOSPITAL_COMMUNITY): Payer: Self-pay | Admitting: Orthopedic Surgery

## 2018-09-04 DIAGNOSIS — L8990 Pressure ulcer of unspecified site, unspecified stage: Secondary | ICD-10-CM | POA: Diagnosis present

## 2018-09-04 DIAGNOSIS — E875 Hyperkalemia: Secondary | ICD-10-CM

## 2018-09-04 HISTORY — DX: Hyperkalemia: E87.5

## 2018-09-04 HISTORY — DX: Hypocalcemia: E83.51

## 2018-09-04 LAB — COMPREHENSIVE METABOLIC PANEL
ALT: 13 U/L (ref 0–44)
ANION GAP: 11 (ref 5–15)
AST: 25 U/L (ref 15–41)
Albumin: 2.8 g/dL — ABNORMAL LOW (ref 3.5–5.0)
Alkaline Phosphatase: 46 U/L (ref 38–126)
BUN: 9 mg/dL (ref 6–20)
CO2: 22 mmol/L (ref 22–32)
Calcium: 8 mg/dL — ABNORMAL LOW (ref 8.9–10.3)
Chloride: 108 mmol/L (ref 98–111)
Creatinine, Ser: 0.93 mg/dL (ref 0.44–1.00)
GFR calc Af Amer: >60 mL/min (ref >60)
GFR calc non Af Amer: >60 mL/min (ref >60)
Glucose, Bld: 146 mg/dL — ABNORMAL HIGH (ref 70–99)
Potassium: 4 mmol/L (ref 3.5–5.1)
Sodium: 141 mmol/L (ref 135–145)
Total Bilirubin: 0.3 mg/dL (ref 0.3–1.2)
Total Protein: 5.7 g/dL — ABNORMAL LOW (ref 6.5–8.1)

## 2018-09-04 LAB — MAGNESIUM: Magnesium: 1.9 mg/dL (ref 1.7–2.4)

## 2018-09-04 MED ORDER — ACETAMINOPHEN 325 MG PO TABS: 325.0000 mg | ORAL_TABLET | Freq: Four times a day (QID) | ORAL | 0 refills | Status: AC | PRN

## 2018-09-04 MED ORDER — POLYETHYLENE GLYCOL 3350 17 G PO PACK: 17.0000 g | PACK | Freq: Every day | ORAL | 0 refills | Status: DC | PRN
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
## 2018-09-04 NOTE — Plan of Care (Signed)
  Problem: Pain Managment: Goal: General experience of comfort will improve Outcome: Progressing   Problem: Safety: Goal: Ability to remain free from injury will improve Outcome: Progressing   Problem: Skin Integrity: Goal: Risk for impaired skin integrity will decrease Outcome: Progressing   
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
## 2018-09-05 ENCOUNTER — Encounter (HOSPITAL_BASED_OUTPATIENT_CLINIC_OR_DEPARTMENT_OTHER): Payer: Self-pay

## 2018-09-05 MED ORDER — METHOCARBAMOL 500 MG PO TABS: 500.0000 mg | ORAL_TABLET | Freq: Three times a day (TID) | ORAL | 0 refills | Status: DC

## 2018-09-05 MED ORDER — SENNOSIDES-DOCUSATE SODIUM 8.6-50 MG PO TABS: 2.0000 | ORAL_TABLET | Freq: Every day | ORAL | 1 refills | Status: AC

## 2018-09-05 MED ORDER — ENOXAPARIN SODIUM 40 MG/0.4ML ~~LOC~~ SOLN: 40.0000 mg | Freq: Every morning | SUBCUTANEOUS | 0 refills | Status: DC

## 2018-09-05 MED ORDER — GABAPENTIN 300 MG PO CAPS: 300.0000 mg | ORAL_CAPSULE | Freq: Two times a day (BID) | ORAL | 0 refills | Status: DC

## 2018-09-05 NOTE — Discharge Instructions (Signed)
1) you are taking Lovenox 40 mg subcutaneously daily for blood thinner----to prevent blood clots last dose should be 09/09/2018 2) you are taking Lovenox a blood thinner so Avoid ibuprofen/Advil/Aleve/Motrin/Goody Powders/Naproxen/BC powders/Meloxicam/Diclofenac/Indomethacin and other Nonsteroidal anti-inflammatory medications as these will make you more likely to bleed and can cause stomach ulcers, can also cause Kidney problems.   3) you are scheduled for orthopedic surgery on 09/11/2018 at [facility]----Dr. Marcial Pacas Murphy's office will contact you with details  Diet: As you were doing prior to hospitalization   Dressing:  Keep splint and dressings on and dry until follow up.  Activity:  Increase activity slowly as tolerated, but follow the weight bearing instructions below.  The rules on driving is that you can not be taking narcotics while you drive, and you must feel in control of the vehicle.    Weight Bearing:  Non weight bearing Right wrist and Right leg.  You may bear weight on elbow with platform walker.  To prevent constipation: you may use a stool softener such as -  Colace (over the counter) 100 mg by mouth twice a day  Drink plenty of fluids (prune juice may be helpful) and high fiber foods Miralax (over the counter) for constipation as needed.    Itching:  If you experience itching with your medications, try taking only a single pain pill, or even half a pain pill at a time.  You can also use benadryl over the counter for itching or also to help with sleep.   Precautions:  If you experience chest pain or shortness of breath - call 911 immediately for transfer to the hospital emergency department!!  If you develop a fever greater that 101 F, purulent drainage from wound, increased redness or drainage from wound, or calf pain -- Call the office at [phone]                                                 Follow- Up Appointment:  Please call office to confirm  scheduling of your ankle surgery - [phone]  1) you are taking Lovenox 40 mg subcutaneously daily for blood thinner----to prevent blood clots last dose should be 09/09/2018 2) you are taking Lovenox a blood thinner so Avoid ibuprofen/Advil/Aleve/Motrin/Goody Powders/Naproxen/BC powders/Meloxicam/Diclofenac/Indomethacin and other Nonsteroidal anti-inflammatory medications as these will make you more likely to bleed and can cause stomach ulcers, can also cause Kidney problems.   3) you are scheduled for orthopedic surgery on 09/11/2018 at [facility]----Dr. Marcial Pacas Murphy's office will contact you with details
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
## 2018-09-05 NOTE — Progress Notes (Signed)
Discharge instructions given. Pt verbalized understanding and all questions were answered. Awaiting PTAR.
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
## 2018-09-07 ENCOUNTER — Encounter (HOSPITAL_BASED_OUTPATIENT_CLINIC_OR_DEPARTMENT_OTHER): Payer: Self-pay

## 2018-09-07 ENCOUNTER — Other Ambulatory Visit: Payer: Self-pay

## 2018-09-07 NOTE — Progress Notes (Signed)
Spoke with:  Nitisha NPO:  After Midnight, no gum, candy, or mints   Arrival time: 4259DG Labs: Labs in epic/chart AM medications: Atenolol, Omeprazole, Loratadine Pre op orders: Yes Ride home:  Transported via EMS, Son Middie Hafler will be home day of surgery [phone]

## 2018-09-09 ENCOUNTER — Emergency Department (HOSPITAL_COMMUNITY)
Admission: EM | Admit: 2018-09-09 | Discharge: 2018-09-09 | Disposition: A | Discharge status: Home / Self Care | Payer: No Typology Code available for payment source | Attending: Emergency Medicine | Admitting: Emergency Medicine

## 2018-09-09 ENCOUNTER — Encounter (HOSPITAL_COMMUNITY): Payer: Self-pay | Admitting: Emergency Medicine

## 2018-09-09 ENCOUNTER — Emergency Department (HOSPITAL_COMMUNITY): Payer: No Typology Code available for payment source

## 2018-09-09 ENCOUNTER — Other Ambulatory Visit: Payer: Self-pay

## 2018-09-09 DIAGNOSIS — Z87891 Personal history of nicotine dependence: Secondary | ICD-10-CM | POA: Diagnosis not present

## 2018-09-09 DIAGNOSIS — Z79899 Other long term (current) drug therapy: Secondary | ICD-10-CM | POA: Insufficient documentation

## 2018-09-09 DIAGNOSIS — I10 Essential (primary) hypertension: Secondary | ICD-10-CM | POA: Diagnosis not present

## 2018-09-09 DIAGNOSIS — Z7982 Long term (current) use of aspirin: Secondary | ICD-10-CM | POA: Diagnosis not present

## 2018-09-09 DIAGNOSIS — M25571 Pain in right ankle and joints of right foot: Secondary | ICD-10-CM | POA: Diagnosis not present
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
## 2018-09-10 MED ORDER — VANCOMYCIN HCL 10 G IV SOLR
1500.0000 mg | Freq: Once | INTRAVENOUS | Status: AC
  Administered 2018-09-11: 1500 mg via INTRAVENOUS
  Filled 2018-09-10 (×2): qty 1500
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
## 2018-09-11 ENCOUNTER — Encounter (HOSPITAL_BASED_OUTPATIENT_CLINIC_OR_DEPARTMENT_OTHER)
Admission: RE | Disposition: A | Discharge status: Home / Self Care | Payer: Self-pay | Source: Home / Self Care | Attending: Orthopedic Surgery

## 2018-09-11 ENCOUNTER — Ambulatory Visit (HOSPITAL_BASED_OUTPATIENT_CLINIC_OR_DEPARTMENT_OTHER)
Admission: RE | Admit: 2018-09-11 | Discharge: 2018-09-11 | Disposition: A | Discharge status: Home / Self Care | Payer: No Typology Code available for payment source | Attending: Orthopedic Surgery | Admitting: Orthopedic Surgery

## 2018-09-11 ENCOUNTER — Ambulatory Visit (HOSPITAL_BASED_OUTPATIENT_CLINIC_OR_DEPARTMENT_OTHER): Payer: No Typology Code available for payment source | Admitting: Anesthesiology

## 2018-09-11 ENCOUNTER — Encounter (HOSPITAL_BASED_OUTPATIENT_CLINIC_OR_DEPARTMENT_OTHER): Payer: Self-pay

## 2018-09-11 ENCOUNTER — Other Ambulatory Visit: Payer: Self-pay

## 2018-09-11 DIAGNOSIS — I10 Essential (primary) hypertension: Secondary | ICD-10-CM | POA: Diagnosis not present

## 2018-09-11 DIAGNOSIS — S82841A Displaced bimalleolar fracture of right lower leg, initial encounter for closed fracture: Secondary | ICD-10-CM | POA: Insufficient documentation

## 2018-09-11 DIAGNOSIS — K219 Gastro-esophageal reflux disease without esophagitis: Secondary | ICD-10-CM | POA: Insufficient documentation

## 2018-09-11 DIAGNOSIS — Z6839 Body mass index (BMI) 39.0-39.9, adult: Secondary | ICD-10-CM | POA: Diagnosis not present

## 2018-09-11 DIAGNOSIS — S82891A Other fracture of right lower leg, initial encounter for closed fracture: Secondary | ICD-10-CM

## 2018-09-11 DIAGNOSIS — Z87891 Personal history of nicotine dependence: Secondary | ICD-10-CM | POA: Diagnosis not present

## 2018-09-11 HISTORY — DX: Palpitations: R00.2

## 2018-09-11 HISTORY — DX: Unspecified hearing loss, unspecified ear: H91.90

## 2018-09-11 HISTORY — DX: Nausea with vomiting, unspecified: R11.2

## 2018-09-11 HISTORY — DX: Hypocalcemia: E83.51

## 2018-09-11 HISTORY — DX: Anxiety disorder, unspecified: F41.9

## 2018-09-11 HISTORY — DX: Insomnia, unspecified: G47.00

## 2018-09-11 HISTORY — DX: Gastro-esophageal reflux disease without esophagitis: K21.9

## 2018-09-11 HISTORY — DX: Other specified postprocedural states: Z98.890

## 2018-09-11 HISTORY — DX: Other fracture of unspecified lower leg, initial encounter for closed fracture: S82.899A

## 2018-09-11 HISTORY — DX: Pressure ulcer of unspecified site, stage 1: L89.91

## 2018-09-11 HISTORY — DX: Solitary pulmonary nodule: R91.1

## 2018-09-11 HISTORY — DX: Hyperkalemia: E87.5

## 2018-09-11 HISTORY — DX: Presence of spectacles and contact lenses: Z97.3

## 2018-09-11 HISTORY — PX: ORIF ANKLE FRACTURE: SHX5408

## 2018-09-11 HISTORY — DX: Fracture of unspecified carpal bone, unspecified wrist, initial encounter for closed fracture: S62.109A

## 2018-09-11 HISTORY — DX: Atherosclerosis of aorta: I70.0

## 2018-09-11 SURGERY — OPEN REDUCTION INTERNAL FIXATION (ORIF) ANKLE FRACTURE
Anesthesia: General | Laterality: Right

## 2018-09-11 MED ORDER — MIDAZOLAM HCL 2 MG/2ML IJ SOLN
2.0000 mg | Freq: Once | INTRAMUSCULAR | Status: AC
  Administered 2018-09-11: 2 mg via INTRAVENOUS
  Filled 2018-09-11: qty 2

## 2018-09-11 MED ORDER — DEXAMETHASONE SODIUM PHOSPHATE 10 MG/ML IJ SOLN
INTRAMUSCULAR | Status: DC | PRN
  Administered 2018-09-11: 4 mg via INTRAVENOUS

## 2018-09-11 MED ORDER — FENTANYL CITRATE (PF) 100 MCG/2ML IJ SOLN
25.0000 ug | INTRAMUSCULAR | Status: DC | PRN
  Administered 2018-09-11 (×2): 50 ug via INTRAVENOUS
  Filled 2018-09-11: qty 1

## 2018-09-11 MED ORDER — HYDROMORPHONE HCL 1 MG/ML IJ SOLN
INTRAMUSCULAR | Status: AC
  Filled 2018-09-11: qty 1

## 2018-09-11 MED ORDER — LIDOCAINE 2% (20 MG/ML) 5 ML SYRINGE
INTRAMUSCULAR | Status: AC
  Filled 2018-09-11: qty 5

## 2018-09-11 MED ORDER — PROPOFOL 10 MG/ML IV BOLUS
INTRAVENOUS | Status: AC
  Filled 2018-09-11: qty 20

## 2018-09-11 MED ORDER — FENTANYL CITRATE (PF) 100 MCG/2ML IJ SOLN
50.0000 ug | Freq: Once | INTRAMUSCULAR | Status: AC
  Administered 2018-09-11: 50 ug via INTRAVENOUS
  Filled 2018-09-11: qty 1

## 2018-09-11 MED ORDER — LACTATED RINGERS IV SOLN
INTRAVENOUS | Status: DC
  Administered 2018-09-11: 12:00:00 via INTRAVENOUS
  Administered 2018-09-11: 1000 mL via INTRAVENOUS
  Filled 2018-09-11: qty 1000

## 2018-09-11 MED ORDER — BUPIVACAINE-EPINEPHRINE (PF) 0.5% -1:200000 IJ SOLN
INTRAMUSCULAR | Status: DC | PRN
  Administered 2018-09-11: 20 mL via PERINEURAL
  Administered 2018-09-11: 10 mL via PERINEURAL

## 2018-09-11 MED ORDER — FENTANYL CITRATE (PF) 100 MCG/2ML IJ SOLN
INTRAMUSCULAR | Status: AC
  Filled 2018-09-11: qty 2

## 2018-09-11 MED ORDER — GABAPENTIN 300 MG PO CAPS: 300.0000 mg | ORAL_CAPSULE | Freq: Two times a day (BID) | ORAL | 0 refills | Status: DC

## 2018-09-11 MED ORDER — ACETAMINOPHEN 500 MG PO TABS
ORAL_TABLET | ORAL | Status: AC
  Filled 2018-09-11: qty 2

## 2018-09-11 MED ORDER — DEXAMETHASONE SODIUM PHOSPHATE 10 MG/ML IJ SOLN
INTRAMUSCULAR | Status: AC
  Filled 2018-09-11: qty 1

## 2018-09-11 MED ORDER — HYDROCODONE-ACETAMINOPHEN 5-325 MG PO TABS: 1.0000 | ORAL_TABLET | Freq: Four times a day (QID) | ORAL | 0 refills | Status: DC | PRN

## 2018-09-11 MED ORDER — LIDOCAINE HCL (CARDIAC) PF 100 MG/5ML IV SOSY
PREFILLED_SYRINGE | INTRAVENOUS | Status: DC | PRN
  Administered 2018-09-11: 100 mg via INTRAVENOUS

## 2018-09-11 MED ORDER — VANCOMYCIN HCL IN DEXTROSE 1-5 GM/200ML-% IV SOLN
INTRAVENOUS | Status: AC
  Filled 2018-09-11: qty 200

## 2018-09-11 MED ORDER — VANCOMYCIN HCL 500 MG IV SOLR
INTRAVENOUS | Status: AC
  Filled 2018-09-11: qty 500

## 2018-09-11 MED ORDER — GABAPENTIN 300 MG PO CAPS
300.0000 mg | ORAL_CAPSULE | Freq: Once | ORAL | Status: DC
  Filled 2018-09-11: qty 1

## 2018-09-11 MED ORDER — OXYCODONE HCL 5 MG PO TABS
5.0000 mg | ORAL_TABLET | Freq: Once | ORAL | Status: DC | PRN
  Filled 2018-09-11: qty 1

## 2018-09-11 MED ORDER — ACETAMINOPHEN 500 MG PO TABS
1000.0000 mg | ORAL_TABLET | Freq: Once | ORAL | Status: DC
  Filled 2018-09-11: qty 2

## 2018-09-11 MED ORDER — ONDANSETRON HCL 4 MG PO TABS: 4.0000 mg | ORAL_TABLET | Freq: Three times a day (TID) | ORAL | 0 refills | Status: DC | PRN

## 2018-09-11 MED ORDER — OXYCODONE HCL 5 MG/5ML PO SOLN
5.0000 mg | Freq: Once | ORAL | Status: DC | PRN
  Filled 2018-09-11: qty 5

## 2018-09-11 MED ORDER — HYDROMORPHONE HCL 1 MG/ML IJ SOLN
0.5000 mg | INTRAMUSCULAR | Status: DC | PRN
  Administered 2018-09-11: 0.25 mg via INTRAVENOUS
  Filled 2018-09-11: qty 0.5

## 2018-09-11 MED ORDER — ONDANSETRON HCL 4 MG/2ML IJ SOLN
INTRAMUSCULAR | Status: DC | PRN
  Administered 2018-09-11: 4 mg via INTRAVENOUS

## 2018-09-11 MED ORDER — ONDANSETRON HCL 4 MG/2ML IJ SOLN
INTRAMUSCULAR | Status: AC
  Filled 2018-09-11: qty 2

## 2018-09-11 MED ORDER — KETOROLAC TROMETHAMINE 30 MG/ML IJ SOLN
INTRAMUSCULAR | Status: AC
  Filled 2018-09-11: qty 1

## 2018-09-11 MED ORDER — CHLORHEXIDINE GLUCONATE 4 % EX LIQD
60.0000 mL | Freq: Once | CUTANEOUS | Status: DC
  Filled 2018-09-11: qty 118

## 2018-09-11 MED ORDER — FENTANYL CITRATE (PF) 100 MCG/2ML IJ SOLN
INTRAMUSCULAR | Status: DC | PRN
  Administered 2018-09-11 (×2): 50 ug via INTRAVENOUS

## 2018-09-11 MED ORDER — PROMETHAZINE HCL 25 MG/ML IJ SOLN
6.2500 mg | INTRAMUSCULAR | Status: DC | PRN
  Filled 2018-09-11: qty 1

## 2018-09-11 MED ORDER — MIDAZOLAM HCL 2 MG/2ML IJ SOLN
INTRAMUSCULAR | Status: AC
  Filled 2018-09-11: qty 2

## 2018-09-11 MED ORDER — CEFAZOLIN SODIUM-DEXTROSE 2-3 GM-%(50ML) IV SOLR
INTRAVENOUS | Status: DC | PRN
  Administered 2018-09-11: 2 g via INTRAVENOUS

## 2018-09-11 MED ORDER — ACETAMINOPHEN 10 MG/ML IV SOLN
1000.0000 mg | Freq: Once | INTRAVENOUS | Status: DC | PRN
  Filled 2018-09-11: qty 100

## 2018-09-11 MED ORDER — CEFAZOLIN SODIUM 1 G IJ SOLR
INTRAMUSCULAR | Status: AC
  Filled 2018-09-11: qty 20

## 2018-09-11 MED ORDER — ACETAMINOPHEN 500 MG PO TABS
1000.0000 mg | ORAL_TABLET | Freq: Once | ORAL | Status: AC
  Administered 2018-09-11: 1000 mg via ORAL
  Filled 2018-09-11: qty 2

## 2018-09-11 MED ORDER — PROPOFOL 10 MG/ML IV BOLUS
INTRAVENOUS | Status: DC | PRN
  Administered 2018-09-11: 200 mg via INTRAVENOUS

## 2018-09-11 MED ORDER — KETOROLAC TROMETHAMINE 30 MG/ML IJ SOLN
30.0000 mg | Freq: Once | INTRAMUSCULAR | Status: AC
  Administered 2018-09-11: 30 mg via INTRAVENOUS
  Filled 2018-09-11: qty 1

## 2018-09-11 SURGICAL SUPPLY — 76 items
2.5 DRILLBIT ×1 IMPLANT
BANDAGE ACE 4X5 VEL STRL LF (GAUZE/BANDAGES/DRESSINGS) ×2 IMPLANT
BANDAGE ACE 6X5 VEL STRL LF (GAUZE/BANDAGES/DRESSINGS) ×2 IMPLANT
BANDAGE ELASTIC 4 VELCRO ST LF (GAUZE/BANDAGES/DRESSINGS) ×1 IMPLANT
BANDAGE ELASTIC 6 VELCRO ST LF (GAUZE/BANDAGES/DRESSINGS) ×1 IMPLANT
BANDAGE ESMARK 6X9 LF (GAUZE/BANDAGES/DRESSINGS) ×1 IMPLANT
BIT DRILL 2.5X125 (BIT) ×1 IMPLANT
BIT DRILL CANN 2.7 (BIT) ×2
BIT DRILL SRG 2.7XCANN AO CPLG (BIT) IMPLANT
BIT DRL SRG 2.7XCANN AO CPLNG (BIT) ×1
BLADE SURG 15 STRL LF DISP TIS (BLADE) ×2 IMPLANT
BLADE SURG 15 STRL SS (BLADE) ×4
BNDG CMPR 9X6 STRL LF SNTH (GAUZE/BANDAGES/DRESSINGS) ×1
BNDG COHESIVE 4X5 TAN STRL (GAUZE/BANDAGES/DRESSINGS) ×2 IMPLANT
BNDG ESMARK 6X9 LF (GAUZE/BANDAGES/DRESSINGS) ×2
CHLORAPREP W/TINT 26ML (MISCELLANEOUS) ×2 IMPLANT
CLSR STERI-STRIP ANTIMIC 1/2X4 (GAUZE/BANDAGES/DRESSINGS) ×2 IMPLANT
COVER BACK TABLE 60X90IN (DRAPES) ×2 IMPLANT
COVER MAYO STAND STRL (DRAPES) ×2 IMPLANT
COVER WAND RF STERILE (DRAPES) ×2 IMPLANT
CUFF TOURNIQUET SINGLE 24IN (TOURNIQUET CUFF) IMPLANT
CUFF TOURNIQUET SINGLE 34IN LL (TOURNIQUET CUFF) ×1 IMPLANT
DECANTER SPIKE VIAL GLASS SM (MISCELLANEOUS) IMPLANT
DRAPE EXTREMITY T 121X128X90 (DISPOSABLE) ×2 IMPLANT
DRAPE IMP U-DRAPE 54X76 (DRAPES) ×2 IMPLANT
DRAPE OEC MINIVIEW 54X84 (DRAPES) ×2 IMPLANT
DRAPE U-SHAPE 47X51 STRL (DRAPES) ×2 IMPLANT
DRSG EMULSION OIL 3X3 NADH (GAUZE/BANDAGES/DRESSINGS) ×2 IMPLANT
DRSG PAD ABDOMINAL 8X10 ST (GAUZE/BANDAGES/DRESSINGS) ×2 IMPLANT
ELECT REM PT RETURN 9FT ADLT (ELECTROSURGICAL) ×2
ELECTRODE REM PT RTRN 9FT ADLT (ELECTROSURGICAL) ×1 IMPLANT
GAUZE SPONGE 4X4 12PLY STRL (GAUZE/BANDAGES/DRESSINGS) ×2 IMPLANT
GLOVE BIO SURGEON STRL SZ7.5 (GLOVE) ×4 IMPLANT
GLOVE BIOGEL PI IND STRL 8 (GLOVE) ×2 IMPLANT
GLOVE BIOGEL PI INDICATOR 8 (GLOVE) ×2
GOWN STRL REUS W/ TWL LRG LVL3 (GOWN DISPOSABLE) ×2 IMPLANT
GOWN STRL REUS W/ TWL XL LVL3 (GOWN DISPOSABLE) ×1 IMPLANT
GOWN STRL REUS W/TWL LRG LVL3 (GOWN DISPOSABLE) ×4
GOWN STRL REUS W/TWL XL LVL3 (GOWN DISPOSABLE) ×2
GUIDE WIRE UNTHRD 1.4X150 (Wire) ×4 IMPLANT
GUIDEWIRE UNTHRD 1.4X150 (Wire) IMPLANT
KWIRE 1.4X5.0 ×2 IMPLANT
NEEDLE HYPO 22GX1.5 SAFETY (NEEDLE) ×1 IMPLANT
NS IRRIG 1000ML POUR BTL (IV SOLUTION) ×2 IMPLANT
PACK BASIN DAY SURGERY FS (CUSTOM PROCEDURE TRAY) ×2 IMPLANT
PAD CAST 4YDX4 CTTN HI CHSV (CAST SUPPLIES) ×1 IMPLANT
PADDING CAST ABS 4INX4YD NS (CAST SUPPLIES) ×2
PADDING CAST ABS COTTON 4X4 ST (CAST SUPPLIES) ×2 IMPLANT
PADDING CAST COTTON 4X4 STRL (CAST SUPPLIES) ×4
PADDING CAST COTTON 6X4 STRL (CAST SUPPLIES) ×4 IMPLANT
PENCIL BUTTON HOLSTER BLD 10FT (ELECTRODE) ×2 IMPLANT
PLATE 1/3 TUBULAR 7H (Plate) ×1 IMPLANT
SCREW CANC FT 16X4X2.5XHEX (Screw) IMPLANT
SCREW CANCELLOUS 4.0X14 (Screw) ×1 IMPLANT
SCREW CANCELLOUS 4.0X16MM (Screw) ×2 IMPLANT
SCREW CANN ASNIS III 4.0X40MM (Screw) ×2 IMPLANT
SCREW CORTEX ST MATTA 3.5X14 (Screw) ×3 IMPLANT
SCREW CORTEX ST MATTA 3.5X18MM (Screw) ×1 IMPLANT
SPLINT FAST PLASTER 5X30 (CAST SUPPLIES) ×20
SPLINT PLASTER CAST FAST 5X30 (CAST SUPPLIES) ×20 IMPLANT
SPONGE LAP 4X18 RFD (DISPOSABLE) ×2 IMPLANT
SUCTION FRAZIER HANDLE 10FR (MISCELLANEOUS) ×1
SUCTION TUBE FRAZIER 10FR DISP (MISCELLANEOUS) ×1 IMPLANT
SUT ETHILON 3 0 PS 1 (SUTURE) ×3 IMPLANT
SUT MNCRL AB 4-0 PS2 18 (SUTURE) IMPLANT
SUT MON AB 2-0 CT1 36 (SUTURE) IMPLANT
SUT MON AB 3-0 SH 27 (SUTURE)
SUT MON AB 3-0 SH27 (SUTURE) IMPLANT
SUT VIC AB 0 SH 27 (SUTURE) ×2 IMPLANT
SUT VIC AB 2-0 SH 27 (SUTURE) ×4
SUT VIC AB 2-0 SH 27XBRD (SUTURE) IMPLANT
SYR BULB 3OZ (MISCELLANEOUS) ×2 IMPLANT
SYR CONTROL 10ML LL (SYRINGE) IMPLANT
TOWEL OR 17X26 10 PK STRL BLUE (TOWEL DISPOSABLE) ×4 IMPLANT
TUBE CONNECTING 12X1/4 (SUCTIONS) ×2 IMPLANT
UNDERPAD 30X30 (UNDERPADS AND DIAPERS) ×2 IMPLANT

## 2018-09-11 NOTE — Anesthesia Procedure Notes (Signed)
Procedure Name: LMA Insertion Date/Time: 09/11/2018 9:24 AM Performed by: Yolonda Kida, CRNA Pre-anesthesia Checklist: Patient identified, Emergency Drugs available, Suction available and Patient being monitored Patient Re-evaluated:Patient Re-evaluated prior to induction Oxygen Delivery Method: Circle system utilized Preoxygenation: Pre-oxygenation with 100% oxygen Induction Type: IV induction LMA: LMA inserted LMA Size: 4.0 Number of attempts: 1 Placement Confirmation: positive ETCO2,  CO2 detector and breath sounds checked- equal and bilateral Tube secured with: Tape Dental Injury: Teeth and Oropharynx as per pre-operative assessment

## 2018-09-11 NOTE — Op Note (Signed)
09/11/2018  10:25 AM  PATIENT:  [name]    PRE-OPERATIVE DIAGNOSIS:  ANKLE FRACTURE  POST-OPERATIVE DIAGNOSIS:  Same  PROCEDURE:  OPEN REDUCTION INTERNAL FIXATION (ORIF) RIGHT BIMALLEOLAR ANKLE FRACTURE  SURGEON:  Sheral Apley, MD  ASSISTANT: Aquilla Hacker, PA-C, he was present and scrubbed throughout the case, critical for completion in a timely fashion, and for retraction, instrumentation, and closure.   ANESTHESIA:   gen   PREOPERATIVE INDICATIONS:  [name] is a  53 y.o. female with a diagnosis of ANKLE FRACTURE who failed conservative measures and elected for surgical management.    The risks benefits and alternatives were discussed with the patient preoperatively including but not limited to the risks of infection, bleeding, nerve injury, cardiopulmonary complications, the need for revision surgery, among others, and the patient was willing to proceed.  OPERATIVE IMPLANTS: stryker plate  OPERATIVE FINDINGS: Unstable ankle fracture. Stable syndesmosis post op  BLOOD LOSS: min  COMPLICATIONS: none  TOURNIQUET TIME: 45  OPERATIVE PROCEDURE:  Patient was identified in the preoperative holding area and site was marked by me He was transported to the operating theater and placed on the table in supine position taking care to pad all bony prominences. After a preincinduction time out anesthesia was induced. The right lower extremity was prepped and draped in normal sterile fashion and a pre-incision timeout was performed. [name] received vanc/ancef for preoperative antibiotics.   I made a lateral incision of roughly 7 cm dissection was carried down sharply to the distal fibula and then spreading dissection was used proximally to protect the superficial peroneal nerve. I sharply incised the periosteum and took care to protect the peroneal tendons. I then debrided the fracture site and performed a reduction maneuver which was held in place with a  clamp.   I placed a 1/3 tubular plate in a bridge fasion  I then selected a 7-hole one third tubular plate and placed in a neutralization fashion care was taken distally so as not to penetrate the joint with the cancellus screws.  I then turned my attention medially where I created a 4 cm incision and dissected sharply down to the medial Mal fracture taking care to protect the saphenous vein. I debrided the fracture and reduced and held in place with a tenaculum. I then drilled and placed 2 partially threaded 45 mm cannulated screws one anterior and one posterior across the fracture.  I then stressed the syndesmosis and it was stable  The wound was then thoroughly irrigated and closed using a 0 Vicryl and absorbable Monocryl sutures. He was placed in a short leg splint.   POST OPERATIVE PLAN: Non-weightbearing. DVT prophylaxis will consist of mobilization and chemical px

## 2018-09-11 NOTE — Transfer of Care (Signed)
Immediate Anesthesia Transfer of Care Note  Patient: [name]  Procedure(s) Performed: OPEN REDUCTION INTERNAL FIXATION (ORIF) RIGHT BIMALLEOLAR ANKLE FRACTURE (Right )  Patient Location: PACU  Anesthesia Type:GA combined with regional for post-op pain  Level of Consciousness: awake, alert , oriented and patient cooperative  Airway & Oxygen Therapy: Patient Spontanous Breathing and Patient connected to nasal cannula oxygen  Post-op Assessment: Report given to RN and Post -op Vital signs reviewed and stable  Post vital signs: Reviewed and stable  Last Vitals:  Vitals Value Taken Time  BP 161/95 09/11/2018 11:01 AM  Temp    Pulse 101 09/11/2018 11:04 AM  Resp 19 09/11/2018 11:04 AM  SpO2 95 % 09/11/2018 11:04 AM  Vitals shown include unvalidated device data.  Last Pain:  Vitals:   09/11/18 0824  PainSc: 5       Patients Stated Pain Goal: 8 (09/11/18 0824)  Complications: No apparent anesthesia complications

## 2018-09-11 NOTE — Anesthesia Procedure Notes (Signed)
Anesthesia Regional Block: Adductor canal block   Pre-Anesthetic Checklist: ,, timeout performed, Correct Patient, Correct Site, Correct Laterality, Correct Procedure, Correct Position, site marked, Risks and benefits discussed, pre-op evaluation,  At surgeon's request and post-op pain management  Laterality: Right  Prep: Maximum Sterile Barrier Precautions used, chloraprep       Needles:  Injection technique: Single-shot  Needle Type: Echogenic Stimulator Needle     Needle Length: 9cm  Needle Gauge: 22     Additional Needles:   Procedures:,,,, ultrasound used (permanent image in chart),,,,  Narrative:  Start time: 09/11/2018 9:05 AM End time: 09/11/2018 9:07 AM Injection made incrementally with aspirations every 5 mL.  Performed by: Personally  Anesthesiologist: Kaylyn Layer, MD  Additional Notes: Risks, benefits, and alternative discussed. Patient gave consent for procedure. Patient prepped and draped in sterile fashion. Sedation administered, patient remains easily responsive to voice. Relevant anatomy identified with ultrasound guidance. Local anesthetic given in 5cc increments with no signs or symptoms of intravascular injection. No pain or paraesthesias with injection. Patient monitored throughout procedure with signs of LAST or immediate complications. Tolerated well. Ultrasound image placed in chart.  Jaime Greenhouse, MD

## 2018-09-11 NOTE — Progress Notes (Signed)
Time out performed by Dr. Stephannie Peters prior to blocks. Correct patient, site, side, surgery, surgeon verified. Pt tolerated well. VSS.

## 2018-09-11 NOTE — Anesthesia Postprocedure Evaluation (Signed)
Anesthesia Post Note  Patient: [name]  Procedure(s) Performed: OPEN REDUCTION INTERNAL FIXATION (ORIF) RIGHT BIMALLEOLAR ANKLE FRACTURE (Right )     Patient location during evaluation: PACU Anesthesia Type: General Level of consciousness: awake and alert Pain management: pain level controlled Vital Signs Assessment: post-procedure vital signs reviewed and stable Respiratory status: spontaneous breathing, nonlabored ventilation and respiratory function stable Cardiovascular status: blood pressure returned to baseline and stable Postop Assessment: no apparent nausea or vomiting Anesthetic complications: no           Kaylyn Layer

## 2018-09-11 NOTE — Interval H&P Note (Signed)
I participated in the care of this patient and agree with the above history, physical and evaluation. I performed a review of the history and a physical exam as detailed   Timothy Daniel Murphy MD  

## 2018-09-11 NOTE — Anesthesia Procedure Notes (Signed)
Anesthesia Regional Block: Popliteal block   Pre-Anesthetic Checklist: ,, timeout performed, Correct Patient, Correct Site, Correct Laterality, Correct Procedure, Correct Position, site marked, Risks and benefits discussed, pre-op evaluation,  At surgeon's request and post-op pain management  Laterality: Right  Prep: Maximum Sterile Barrier Precautions used, chloraprep       Needles:  Injection technique: Single-shot  Needle Type: Echogenic Stimulator Needle     Needle Length: 9cm  Needle Gauge: 22     Additional Needles:   Procedures:,,,, ultrasound used (permanent image in chart),,,,  Narrative:  Start time: 09/11/2018 9:09 AM End time: 09/11/2018 9:10 AM Injection made incrementally with aspirations every 5 mL.  Performed by: Personally  Anesthesiologist: Kaylyn Layer, MD  Additional Notes: Risks, benefits, and alternative discussed. Patient gave consent for procedure. Patient prepped and draped in sterile fashion. Sedation administered, patient remains easily responsive to voice. Relevant anatomy identified with ultrasound guidance. Local anesthetic given in 5cc increments with no signs or symptoms of intravascular injection. No pain or paraesthesias with injection. Patient monitored throughout procedure with signs of LAST or immediate complications. Tolerated well. Ultrasound image placed in chart.  Jaime Greenhouse, MD

## 2018-09-11 NOTE — Discharge Instructions (Signed)
NO ADVIL, ALEVE, MOTRIN, IBUPROFEN UNTIL 530 PM 5ODAY   Elevate leg - Toes above your nose at all times to reduce pain / swelling. If needed, you may increase pain medication for the first few days post op to 2 tablets every 4 hours.  Start taking Aspirin 81 mg twice daily for 30 days for DVT prophylaxis.  You may loosen and re-apply ace wrap if it feels too tight.  Weight Bearing:  Non weight bearing affected leg.  Diet: As you were doing prior to hospitalization   Shower:  You have a splint on, leave the splint in place and keep the splint dry with a plastic bag.  Dressing:  You have a splint. Leave the splint in place and we will change your bandages during your first follow-up appointment.    Activity:  Increase activity slowly as tolerated, but follow the weight bearing instructions below.  The rules on driving is that you can not be taking narcotics while you drive, and you must feel in control of the vehicle.    To prevent constipation:  Narcotic medicines cause constipation.  Wean these as soon as is appropriate.   You may use a stool softener such as -  Colace (over the counter) 100 mg by mouth twice a day  Drink plenty of fluids (prune juice may be helpful) and high fiber foods Miralax (over the counter) for constipation as needed.    Itching:  If you experience itching with your medications, try taking only a single pain pill, or even half a pain pill at a time.  You can also use benadryl over the counter for itching or also to help with sleep.   Precautions:  If you experience chest pain or shortness of breath - call 911 immediately for transfer to the hospital emergency department!!  If you develop a fever greater that 101 F, purulent drainage from wound, increased redness or drainage from wound, or calf pain -- Call the office at [phone]                                                 Follow- Up Appointment:  Please call for an appointment to be seen in 2 weeks  [place] - [phone]  Regional Anesthesia Blocks  1. Numbness or the inability to move the "blocked" extremity may last from 3-48 hours after placement. The length of time depends on the medication injected and your individual response to the medication. If the numbness is not going away after 48 hours, call your surgeon.  2. The extremity that is blocked will need to be protected until the numbness is gone and the  Strength has returned. Because you cannot feel it, you will need to take extra care to avoid injury. Because it may be weak, you may have difficulty moving it or using it. You may not know what position it is in without looking at it while the block is in effect.  3. For blocks in the legs and feet, returning to weight bearing and walking needs to be done carefully. You will need to wait until the numbness is entirely gone and the strength has returned. You should be able to move your leg and foot normally before you try and bear weight or walk. You will need someone to be with you when you first try to ensure  you do not fall and possibly risk injury.  4. Bruising and tenderness at the needle site are common side effects and will resolve in a few days.  5. Persistent numbness or new problems with movement should be communicated to the surgeon or the [facility] [id number] [facility] [phone]).   Post Anesthesia Home Care Instructions  Activity: Get plenty of rest for the remainder of the day. A responsible adult should stay with you for 24 hours following the procedure.  For the next 24 hours, DO NOT: -Drive a car -Advertising copywriter -Drink alcoholic beverages -Take any medication unless instructed by your physician -Make any legal decisions or sign important papers.  Meals: Start with liquid foods such as gelatin or soup. Progress to regular foods as tolerated. Avoid greasy, spicy, heavy foods. If nausea and/or vomiting occur, drink  only clear liquids until the nausea and/or vomiting subsides. Call your physician if vomiting continues.  Special Instructions/Symptoms: Your throat may feel dry or sore from the anesthesia or the breathing tube placed in your throat during surgery. If this causes discomfort, gargle with warm salt water. The discomfort should disappear within 24 hours.  If you had a scopolamine patch placed behind your ear for the management of post- operative nausea and/or vomiting:  1. The medication in the patch is effective for 72 hours, after which it should be removed.  Wrap patch in a tissue and discard in the trash. Wash hands thoroughly with soap and water. 2. You may remove the patch earlier than 72 hours if you experience unpleasant side effects which may include dry mouth, dizziness or visual disturbances. 3. Avoid touching the patch. Wash your hands with soap and water after contact with the patch.

## 2018-09-12 ENCOUNTER — Encounter (HOSPITAL_BASED_OUTPATIENT_CLINIC_OR_DEPARTMENT_OTHER): Payer: Self-pay | Admitting: Orthopedic Surgery
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
## 2019-07-19 ENCOUNTER — Encounter (HOSPITAL_COMMUNITY): Payer: Self-pay | Admitting: Emergency Medicine

## 2019-07-19 ENCOUNTER — Other Ambulatory Visit: Payer: Self-pay

## 2019-07-19 ENCOUNTER — Emergency Department (HOSPITAL_COMMUNITY)
Admission: EM | Admit: 2019-07-19 | Discharge: 2019-07-19 | Disposition: A | Discharge status: Home / Self Care | Payer: Self-pay | Attending: Emergency Medicine | Admitting: Emergency Medicine

## 2019-07-19 DIAGNOSIS — R5383 Other fatigue: Secondary | ICD-10-CM | POA: Insufficient documentation

## 2019-07-19 DIAGNOSIS — Z5321 Procedure and treatment not carried out due to patient leaving prior to being seen by health care provider: Secondary | ICD-10-CM | POA: Insufficient documentation

## 2019-07-19 NOTE — ED Triage Notes (Signed)
Patient reports she started feeling ill on Tuesday. Started vomiting Wednesday night and vomited until 9:30-10:00 on Thursday. Patient reports chills, fever, and body aches.

## 2019-07-19 NOTE — ED Notes (Signed)
Not in lobby

## 2019-07-20 ENCOUNTER — Other Ambulatory Visit: Payer: Self-pay

## 2019-07-20 ENCOUNTER — Ambulatory Visit
Admission: EM | Admit: 2019-07-20 | Discharge: 2019-07-20 | Disposition: A | Discharge status: Home / Self Care | Payer: Self-pay | Attending: Emergency Medicine | Admitting: Emergency Medicine

## 2019-07-20 DIAGNOSIS — R52 Pain, unspecified: Secondary | ICD-10-CM

## 2019-07-20 DIAGNOSIS — Z20822 Contact with and (suspected) exposure to covid-19: Secondary | ICD-10-CM

## 2019-07-20 DIAGNOSIS — Z20828 Contact with and (suspected) exposure to other viral communicable diseases: Secondary | ICD-10-CM

## 2019-07-20 DIAGNOSIS — R509 Fever, unspecified: Secondary | ICD-10-CM

## 2019-07-20 DIAGNOSIS — M791 Myalgia, unspecified site: Secondary | ICD-10-CM

## 2019-07-20 NOTE — ED Triage Notes (Signed)
Pt states that she had vomiting on Wednesday and then developed fever and body aches has also lost taste and smell. Took advil at 0430 this morning

## 2019-07-20 NOTE — Discharge Instructions (Signed)
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
## 2019-07-22 LAB — NOVEL CORONAVIRUS, NAA: SARS-CoV-2, NAA: NOT DETECTED

## 2020-11-05 ENCOUNTER — Other Ambulatory Visit: Payer: Self-pay

## 2020-11-05 ENCOUNTER — Telehealth: Payer: Self-pay

## 2020-11-05 DIAGNOSIS — N6310 Unspecified lump in the right breast, unspecified quadrant: Secondary | ICD-10-CM

## 2020-11-05 NOTE — Telephone Encounter (Signed)
Pt left VM on nurse line stating she received a call from our office and is returning call. Called pt who states she is being referred by [facility] in [facility]. Per chart review pt has upcoming appts with [facility].   Pt reports she found small mobile lump on breast that is the size of a pinto bean or large pea. States the area is sore. History of breast cancer in family. I explained that she is scheduled for mammo and [place] at this upcoming appt on 11/26/20. [facility] phone number given for further follow-up information. Encouraged pt to call their dept to be sure she is prepared for appt.

## 2020-11-26 ENCOUNTER — Ambulatory Visit: Payer: No Typology Code available for payment source | Admitting: *Deleted

## 2020-11-26 ENCOUNTER — Ambulatory Visit
Admission: RE | Admit: 2020-11-26 | Discharge: 2020-11-26 | Disposition: A | Discharge status: Home / Self Care | Payer: No Typology Code available for payment source | Source: Ambulatory Visit | Attending: Obstetrics and Gynecology | Admitting: Obstetrics and Gynecology

## 2020-11-26 ENCOUNTER — Other Ambulatory Visit: Payer: Self-pay | Admitting: Obstetrics and Gynecology

## 2020-11-26 ENCOUNTER — Ambulatory Visit (HOSPITAL_COMMUNITY): Payer: No Typology Code available for payment source

## 2020-11-26 ENCOUNTER — Other Ambulatory Visit: Payer: Self-pay

## 2020-11-26 VITALS — BP 136/84 | Wt 242.0 lb

## 2020-11-26 DIAGNOSIS — N6310 Unspecified lump in the right breast, unspecified quadrant: Secondary | ICD-10-CM

## 2020-11-26 DIAGNOSIS — Z1239 Encounter for other screening for malignant neoplasm of breast: Secondary | ICD-10-CM

## 2020-11-26 DIAGNOSIS — N644 Mastodynia: Secondary | ICD-10-CM

## 2020-11-26 DIAGNOSIS — N6312 Unspecified lump in the right breast, upper inner quadrant: Secondary | ICD-10-CM

## 2020-11-26 NOTE — Patient Instructions (Addendum)
Explained breast self awareness with Gerlene Burdock. Patient refused Pap smear today. Explained the importance of having her Pap smear completed. Let her know [facility] will cover Pap smears every 3 years unless has a history of abnormal Pap smears. Referred patient to the [facility] for a diagnostic mammogram. Appointment scheduled Thursday, November 26, 2020 at 1520. Patient aware of appointment and will be there. Gerlene Burdock verbalized understanding.  Mahrukh Seguin, Kathaleen Maser, RN 2:30 PM

## 2020-11-26 NOTE — Progress Notes (Signed)
[name] is a 55 y.o. female who presents to [facility] clinic today with complaint of right breast lump x 3 weeks that is tender to the touch. Patient rated the pain at a 3 out of 10. Patient complained of left outer breast pain for around 30 years that comes and goes. Patient rates the pain at a 2 out of 10.   Pap Smear: Pap smear not completed today. Last Pap smear was 08/19/2017 at a [facility] clinic and was normal. Per patient has no history of an abnormal Pap smear. Last Pap smear result is available in Epic.   Physical exam: Breasts Breasts symmetrical. No skin abnormalities bilateral breasts. No nipple retraction bilateral breasts. No nipple discharge bilateral breasts. No lymphadenopathy. No lumps palpated left breast. Palpated a pea sized lump within the right breast at 2 o'clock 7 cm from the nipple. Complaints of left outer breast tenderness on exam.      Pelvic/Bimanual Patient refused Pap smear today.   Smoking History: Patient is a former smoker that quit 01/29/2013 and has vaped since she quit smoking cigarettes. Patient refused smoking cessation.   Patient Navigation: Patient education provided. Access to services provided for patient through [facility] program.   Colorectal Cancer Screening: Per patient has never had colonoscopy completed. No complaints today.    Breast and Cervical Cancer Risk Assessment: Patient has family history of a paternal aunt having breast cancer. Patient has no known genetic mutations or history of  radiation treatment to the chest before age 40. Patient does not have history of cervical dysplasia, immunocompromised, or DES exposure in-utero.  Risk Assessment    Risk Scores      11/26/2020   Last edited by: Narda Rutherford, LPN   5-year risk: 1 %   Lifetime risk: 6.7 %          A: [facility] exam without pap smear Complaint of right breast lump and bilateral breast pain.  P: Referred patient to the [facility] for a  diagnostic mammogram. Appointment scheduled Thursday, November 26, 2020 at 1520.  Priscille Heidelberg, RN 11/26/2020 2:30 PM

## 2021-02-23 ENCOUNTER — Other Ambulatory Visit: Payer: Self-pay | Admitting: Obstetrics and Gynecology

## 2021-02-23 DIAGNOSIS — N6310 Unspecified lump in the right breast, unspecified quadrant: Secondary | ICD-10-CM

## 2021-02-26 ENCOUNTER — Ambulatory Visit
Admission: RE | Admit: 2021-02-26 | Discharge: 2021-02-26 | Disposition: A | Discharge status: Home / Self Care | Payer: No Typology Code available for payment source | Source: Ambulatory Visit | Attending: Obstetrics and Gynecology | Admitting: Obstetrics and Gynecology

## 2021-02-26 ENCOUNTER — Other Ambulatory Visit: Payer: Self-pay

## 2021-02-26 DIAGNOSIS — N6310 Unspecified lump in the right breast, unspecified quadrant: Secondary | ICD-10-CM

## 2021-03-01 ENCOUNTER — Telehealth: Payer: Self-pay

## 2021-03-01 NOTE — Telephone Encounter (Signed)
No new patient per Gray 

## 2021-03-08 ENCOUNTER — Ambulatory Visit: Payer: No Typology Code available for payment source | Admitting: Nurse Practitioner

## 2021-03-12 ENCOUNTER — Ambulatory Visit: Payer: No Typology Code available for payment source | Admitting: Nurse Practitioner

## 2021-05-28 ENCOUNTER — Other Ambulatory Visit: Payer: No Typology Code available for payment source

## 2021-05-31 ENCOUNTER — Other Ambulatory Visit: Payer: Self-pay | Admitting: Obstetrics and Gynecology

## 2021-05-31 ENCOUNTER — Other Ambulatory Visit: Payer: Self-pay

## 2021-05-31 ENCOUNTER — Ambulatory Visit
Admission: RE | Admit: 2021-05-31 | Discharge: 2021-05-31 | Disposition: A | Discharge status: Home / Self Care | Payer: No Typology Code available for payment source | Source: Ambulatory Visit | Attending: Obstetrics and Gynecology | Admitting: Obstetrics and Gynecology

## 2021-05-31 DIAGNOSIS — N6310 Unspecified lump in the right breast, unspecified quadrant: Secondary | ICD-10-CM

## 2021-09-13 ENCOUNTER — Encounter: Payer: Self-pay | Admitting: Internal Medicine

## 2021-09-13 ENCOUNTER — Other Ambulatory Visit: Payer: Self-pay

## 2021-09-13 ENCOUNTER — Ambulatory Visit (INDEPENDENT_AMBULATORY_CARE_PROVIDER_SITE_OTHER): Payer: BLUE CROSS/BLUE SHIELD | Admitting: Internal Medicine

## 2021-09-13 VITALS — BP 136/92 | HR 76 | Resp 18 | Ht 63.0 in | Wt 244.1 lb

## 2021-09-13 DIAGNOSIS — Z2821 Immunization not carried out because of patient refusal: Secondary | ICD-10-CM

## 2021-09-13 DIAGNOSIS — Z1211 Encounter for screening for malignant neoplasm of colon: Secondary | ICD-10-CM

## 2021-09-13 DIAGNOSIS — R911 Solitary pulmonary nodule: Secondary | ICD-10-CM

## 2021-09-13 DIAGNOSIS — Z0001 Encounter for general adult medical examination with abnormal findings: Secondary | ICD-10-CM | POA: Diagnosis not present

## 2021-09-13 DIAGNOSIS — I10 Essential (primary) hypertension: Secondary | ICD-10-CM | POA: Diagnosis not present

## 2021-09-13 DIAGNOSIS — Z114 Encounter for screening for human immunodeficiency virus [HIV]: Secondary | ICD-10-CM

## 2021-09-13 DIAGNOSIS — E559 Vitamin D deficiency, unspecified: Secondary | ICD-10-CM

## 2021-09-13 DIAGNOSIS — Z1159 Encounter for screening for other viral diseases: Secondary | ICD-10-CM

## 2021-09-13 DIAGNOSIS — K219 Gastro-esophageal reflux disease without esophagitis: Secondary | ICD-10-CM | POA: Insufficient documentation

## 2021-09-13 DIAGNOSIS — S46811S Strain of other muscles, fascia and tendons at shoulder and upper arm level, right arm, sequela: Secondary | ICD-10-CM

## 2021-09-13 DIAGNOSIS — S46811A Strain of other muscles, fascia and tendons at shoulder and upper arm level, right arm, initial encounter: Secondary | ICD-10-CM | POA: Insufficient documentation

## 2021-09-13 MED ORDER — OMEPRAZOLE 20 MG PO CPDR: 20.0000 mg | DELAYED_RELEASE_CAPSULE | Freq: Every day | ORAL | 3 refills | Status: AC

## 2021-09-13 MED ORDER — ATENOLOL 25 MG PO TABS: 25.0000 mg | ORAL_TABLET | Freq: Every day | ORAL | 0 refills | Status: DC

## 2021-09-13 NOTE — Patient Instructions (Addendum)
Please continue taking medications as prescribed.  Please continue to follow low salt diet and ambulate as tolerated.  Please get fasting blood tests done before the next visit. 

## 2021-09-13 NOTE — Assessment & Plan Note (Signed)
BP Readings from Last 1 Encounters:  09/13/21 (!) 136/92   Uncontrolled, could be elevated due to first visit related nervousness On Atenolol, used to have palpitations in the past, now controlled Counseled for compliance with the medications Advised DASH diet and moderate exercise/walking, at least 150 mins/week
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
## 2021-09-13 NOTE — Assessment & Plan Note (Signed)
Well-controlled with Omeprazole 

## 2021-09-13 NOTE — Assessment & Plan Note (Signed)
4 mm nodule noted on CT chest Does not smoke currently, quit about 10 years ago No repeat imaging was advised at that time

## 2021-09-13 NOTE — Assessment & Plan Note (Signed)
Diet modification and moderate exercise advised for now 

## 2021-09-13 NOTE — Assessment & Plan Note (Signed)
Upper neck pain likely due to trapezius muscle strain Advised to use heating pad or warm compresses Tylenol as needed Does not want to take muscle relaxer

## 2021-09-23 ENCOUNTER — Encounter: Payer: Self-pay | Admitting: Internal Medicine

## 2021-09-23 DIAGNOSIS — Z8269 Family history of other diseases of the musculoskeletal system and connective tissue: Secondary | ICD-10-CM | POA: Insufficient documentation

## 2021-09-28 ENCOUNTER — Other Ambulatory Visit: Payer: Self-pay | Admitting: Internal Medicine

## 2021-09-28 DIAGNOSIS — E559 Vitamin D deficiency, unspecified: Secondary | ICD-10-CM | POA: Insufficient documentation

## 2021-09-28 LAB — CBC WITH DIFFERENTIAL/PLATELET
Basophils Absolute: 0 10*3/uL (ref 0.0–0.2)
Basos: 1 %
EOS (ABSOLUTE): 0.2 10*3/uL (ref 0.0–0.4)
Eos: 3 %
Hematocrit: 36.7 % (ref 34.0–46.6)
Hemoglobin: 12.3 g/dL (ref 11.1–15.9)
Immature Grans (Abs): 0 10*3/uL (ref 0.0–0.1)
Immature Granulocytes: 0 %
Lymphocytes Absolute: 1.5 10*3/uL (ref 0.7–3.1)
Lymphs: 23 %
MCH: 30.7 pg (ref 26.6–33.0)
MCHC: 33.5 g/dL (ref 31.5–35.7)
MCV: 92 fL (ref 79–97)
Monocytes Absolute: 0.4 10*3/uL (ref 0.1–0.9)
Monocytes: 6 %
Neutrophils Absolute: 4.3 10*3/uL (ref 1.4–7.0)
Neutrophils: 67 %
Platelets: 294 10*3/uL (ref 150–450)
RBC: 4.01 x10E6/uL (ref 3.77–5.28)
RDW: 14.5 % (ref 11.7–15.4)
WBC: 6.4 10*3/uL (ref 3.4–10.8)

## 2021-09-28 LAB — CMP14+EGFR
ALT: 9 IU/L (ref 0–32)
AST: 15 IU/L (ref 0–40)
Albumin/Globulin Ratio: 1.5 (ref 1.2–2.2)
Albumin: 4 g/dL (ref 3.8–4.9)
Alkaline Phosphatase: 98 IU/L (ref 44–121)
BUN/Creatinine Ratio: 9 (ref 9–23)
BUN: 8 mg/dL (ref 6–24)
Bilirubin Total: 0.4 mg/dL (ref 0.0–1.2)
CO2: 25 mmol/L (ref 20–29)
Calcium: 9 mg/dL (ref 8.7–10.2)
Chloride: 102 mmol/L (ref 96–106)
Creatinine, Ser: 0.86 mg/dL (ref 0.57–1.00)
Globulin, Total: 2.6 g/dL (ref 1.5–4.5)
Glucose: 81 mg/dL (ref 70–99)
Potassium: 4.4 mmol/L (ref 3.5–5.2)
Sodium: 140 mmol/L (ref 134–144)
Total Protein: 6.6 g/dL (ref 6.0–8.5)
eGFR: 80 mL/min/{1.73_m2} (ref >59)

## 2021-09-28 LAB — LIPID PANEL
Chol/HDL Ratio: 4.6 ratio — ABNORMAL HIGH (ref 0.0–4.4)
Cholesterol, Total: 223 mg/dL — ABNORMAL HIGH (ref 100–199)
HDL: 48 mg/dL (ref >39)
LDL Chol Calc (NIH): 142 mg/dL — ABNORMAL HIGH (ref 0–99)
Triglycerides: 181 mg/dL — ABNORMAL HIGH (ref 0–149)
VLDL Cholesterol Cal: 33 mg/dL (ref 5–40)

## 2021-09-28 LAB — HEMOGLOBIN A1C
Est. average glucose Bld gHb Est-mCnc: 114 mg/dL
Hgb A1c MFr Bld: 5.6 % (ref 4.8–5.6)

## 2021-09-28 LAB — VITAMIN D 25 HYDROXY (VIT D DEFICIENCY, FRACTURES): Vit D, 25-Hydroxy: 12.6 ng/mL — ABNORMAL LOW (ref 30.0–100.0)

## 2021-09-28 LAB — HEPATITIS C ANTIBODY: Hep C Virus Ab: NONREACTIVE

## 2021-09-28 LAB — HIV ANTIBODY (ROUTINE TESTING W REFLEX): HIV Screen 4th Generation wRfx: NONREACTIVE

## 2021-09-28 LAB — TSH: TSH: 1.96 u[IU]/mL (ref 0.450–4.500)

## 2021-09-28 MED ORDER — VITAMIN D (ERGOCALCIFEROL) 1.25 MG (50000 UNIT) PO CAPS: 50000.0000 [IU] | ORAL_CAPSULE | ORAL | 1 refills | Status: AC

## 2021-11-29 ENCOUNTER — Inpatient Hospital Stay: Admission: RE | Admit: 2021-11-29 | Payer: BLUE CROSS/BLUE SHIELD | Source: Ambulatory Visit

## 2021-12-08 ENCOUNTER — Telehealth: Payer: Self-pay

## 2021-12-08 NOTE — Telephone Encounter (Signed)
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 She was told not to take any vaccine or any vaccine due to this disease.  Her mother and brother also has the same DX and is also told not to take vaccines.  Maybe other family members not sure.  She needs a letter for a Covid exemption for this because she is being assigned to a place that requires a medical exemption for Covid.  ?

## 2021-12-09 ENCOUNTER — Encounter: Payer: Self-pay | Admitting: *Deleted

## 2021-12-09 NOTE — Telephone Encounter (Signed)
Pt advised with verbal understanding that letter provided in mychart  ?

## 2021-12-15 ENCOUNTER — Ambulatory Visit (INDEPENDENT_AMBULATORY_CARE_PROVIDER_SITE_OTHER): Payer: Self-pay | Admitting: Internal Medicine

## 2021-12-15 ENCOUNTER — Encounter: Payer: Self-pay | Admitting: Internal Medicine

## 2021-12-15 VITALS — BP 122/82 | HR 64 | Resp 16 | Ht 63.0 in | Wt 223.2 lb

## 2021-12-15 DIAGNOSIS — Z8269 Family history of other diseases of the musculoskeletal system and connective tissue: Secondary | ICD-10-CM

## 2021-12-15 DIAGNOSIS — I10 Essential (primary) hypertension: Secondary | ICD-10-CM

## 2021-12-15 DIAGNOSIS — Z0001 Encounter for general adult medical examination with abnormal findings: Secondary | ICD-10-CM | POA: Insufficient documentation

## 2021-12-15 MED ORDER — ATENOLOL 25 MG PO TABS: 25.0000 mg | ORAL_TABLET | Freq: Every day | ORAL | 1 refills | Status: DC

## 2021-12-15 NOTE — Patient Instructions (Signed)
Please continue taking medications as prescribed.  Please continue to follow low carb diet and perform moderate exercise/walking at least 150 mins/week. 

## 2021-12-15 NOTE — Assessment & Plan Note (Addendum)

## 2021-12-15 NOTE — Assessment & Plan Note (Signed)
Has lost about 21 lbs since the last visit with diet modification ?Low carb diet and moderate exercise advised for now ?

## 2021-12-15 NOTE — Progress Notes (Signed)
? ?Established Patient Office Visit ? ?Subjective:  ?Patient ID: [name], female    DOB: [birth year]  Age: 56 y.o. MRN: [id number] ? ?CC:  ?Chief Complaint  ?Patient presents with  ? Annual Exam  ?  Annual exam would like repeat labs since losing weight   ? ? ?HPI ?[name] is a 56 y.o. female with past medical history of HTN, GERD and morbid obesity who presents for annual physical. ? ?HTN: BP is well-controlled. She has been taking atenolol for HTN, which was started for palpitations.  She currently denies any headache, dizziness, chest pain, dyspnea or palpitations. ? ?She has been following low-carb diet, and has cut down on fried food and soft drinks.  She has lost about 21 lbs since the last visit.  She is also planning to start hiking soon. ? ?She denie makess vaccines due to her family history of polymyositis. ? ? ? ? ? ?Past Medical History:  ?Diagnosis Date  ? Ankle fracture 09/02/2018  ? Right  ? Anxiety   ? Aortic atherosclerosis ([place]) 09/02/2018  ? Noted on CT Chest  ? GERD (gastroesophageal reflux disease)   ? Hearing loss   ? Right ear  ? Heart palpitations   ? Hyperkalemia 09/04/2018  ? Hypertension   ? Hypocalcemia 09/04/2018  ? Insomnia   ? Lung nodule 09/02/2018  ? 4 mm RIGHT LOWER lobe nodule, noted on CT chest  ? PONV (postoperative nausea and vomiting)   ? Stage I decubitus ulcer and pressure area   ? pt denies, was sitting on hard stretcher in ER for 19 rooms before transported to her room, no redness noted by family once she went home  ? Wears contact lenses   ? Wears glasses   ? Wrist fracture 09/02/2018  ? Right  ? ? ?Past Surgical History:  ?Procedure Laterality Date  ? CAST APPLICATION Right 4/0/9811  ? Procedure: CAST APPLICATION -RIGHT ANKLE;  Surgeon: Renette Butters, MD;  Location: [facility];  Service: Orthopedics;  Laterality: Right;  ? CESAREAN SECTION    ? KNEE ARTHROSCOPY Left 2005  ? MASTOIDECTOMY Right 1995  ? x8  ? ORIF ANKLE FRACTURE Right 09/11/2018  ?  Procedure: OPEN REDUCTION INTERNAL FIXATION (ORIF) RIGHT BIMALLEOLAR ANKLE FRACTURE;  Surgeon: Renette Butters, MD;  Location: [facility];  Service: Orthopedics;  Laterality: Right;  ? ORIF WRIST FRACTURE Right 09/03/2018  ? Procedure: OPEN REDUCTION INTERNAL FIXATION (ORIF) WRIST FRACTURE;  Surgeon: Renette Butters, MD;  Location: [facility];  Service: Orthopedics;  Laterality: Right;  ? ? ?Family History  ?Problem Relation Age of Onset  ? Cancer Other   ? Heart disease Other   ? Heart disease Father   ? Breast cancer Paternal Aunt   ? Liver cancer Paternal Grandfather   ? ? ?Social History  ? ?Socioeconomic History  ? Marital status: Single  ?  Spouse name: Not on file  ? Number of children: 2  ? Years of education: Not on file  ? Highest education level: Associate degree: occupational, Hotel manager, or vocational program  ?Occupational History  ? Not on file  ?Tobacco Use  ? Smoking status: Former  ?  Years: 32.00  ?  Types: Cigarettes  ?  Quit date: 01/29/2013  ?  Years since quitting: 8.8  ? Smokeless tobacco: Never  ?Vaping Use  ? Vaping Use: Every day  ?Substance and Sexual Activity  ? Alcohol use: No  ? Drug use: No  ?  Sexual activity: Not Currently  ?  Birth control/protection: Abstinence  ?Other Topics Concern  ? Not on file  ?Social History Narrative  ? Not on file  ? ?Social Determinants of Health  ? ?Financial Resource Strain: Not on file  ?Food Insecurity: Not on file  ?Transportation Needs: Not on file  ?Physical Activity: Not on file  ?Stress: Not on file  ?Social Connections: Not on file  ?Intimate Partner Violence: Not on file  ? ? ?Outpatient Medications Prior to Visit  ?Medication Sig Dispense Refill  ? aspirin EC 81 MG tablet Take 81 mg by mouth daily.    ? Biotin 5 MG TABS Take 1 tablet by mouth daily.     ? CALCIUM PO Take 1 tablet by mouth daily.    ? Cyanocobalamin (VITAMIN B 12 PO) Take 1 tablet by mouth daily.     ? loratadine (CLARITIN) 10 MG tablet Take 10 mg by mouth daily.    ?  Multiple Vitamins-Minerals (MULTIVITAMIN ADULT PO) Take by mouth.    ? omeprazole (PRILOSEC) 20 MG capsule Take 1 capsule (20 mg total) by mouth daily. 90 capsule 3  ? Vitamin D, Ergocalciferol, (DRISDOL) 1.25 MG (50000 UNIT) CAPS capsule Take 1 capsule (50,000 Units total) by mouth every 7 (seven) days. 12 capsule 1  ? atenolol (TENORMIN) 25 MG tablet Take 1 tablet (25 mg total) by mouth daily. 90 tablet 0  ? ?No facility-administered medications prior to visit.  ? ? ?Allergies  ?Allergen Reactions  ? Ciprofloxacin Nausea And Vomiting  ? Keflex [Cephalexin]   ?  Oral burning  ? Nitrofuran Derivatives Other (See Comments)  ?  Burning sensation in mouth  ? Other Nausea And Vomiting  ?  Narcotics, Only Norco with Zofran able to take ?  ? Sulfa Antibiotics Nausea And Vomiting  ? ? ?ROS ?Review of Systems  ?Constitutional:  Negative for chills and fever.  ?HENT:  Negative for congestion, sinus pressure, sinus pain and sore throat.   ?Eyes:  Negative for pain and discharge.  ?Respiratory:  Negative for cough and shortness of breath.   ?Cardiovascular:  Negative for chest pain and palpitations.  ?Gastrointestinal:  Negative for abdominal pain, diarrhea, nausea and vomiting.  ?Endocrine: Negative for polydipsia and polyuria.  ?Genitourinary:  Negative for dysuria and hematuria.  ?Musculoskeletal:  Positive for neck pain. Negative for neck stiffness.  ?Skin:  Negative for rash.  ?Neurological:  Negative for dizziness and weakness.  ?Psychiatric/Behavioral:  Negative for agitation and behavioral problems. The patient is nervous/anxious.   ? ?  ?Objective:  ?  ?Physical Exam ?Vitals reviewed.  ?Constitutional:   ?   General: She is not in acute distress. ?   Appearance: She is obese. She is not diaphoretic.  ?HENT:  ?   Head: Normocephalic and atraumatic.  ?   Nose: Nose normal.  ?   Mouth/Throat:  ?   Mouth: Mucous membranes are moist.  ?Eyes:  ?   General: No scleral icterus. ?   Extraocular Movements: Extraocular movements  intact.  ?Cardiovascular:  ?   Rate and Rhythm: Normal rate and regular rhythm.  ?   Pulses: Normal pulses.  ?   Heart sounds: Normal heart sounds. No murmur heard. ?Pulmonary:  ?   Breath sounds: Normal breath sounds. No wheezing or rales.  ?Abdominal:  ?   Palpations: Abdomen is soft.  ?   Tenderness: There is no abdominal tenderness.  ?Musculoskeletal:  ?   Cervical back: Neck supple. No tenderness.  ?  Right lower leg: No edema.  ?   Left lower leg: No edema.  ?Skin: ?   General: Skin is warm.  ?   Findings: No rash.  ?Neurological:  ?   General: No focal deficit present.  ?   Mental Status: She is alert and oriented to person, place, and time.  ?   Cranial Nerves: No cranial nerve deficit.  ?   Sensory: No sensory deficit.  ?   Motor: No weakness.  ?Psychiatric:     ?   Mood and Affect: Mood normal.     ?   Behavior: Behavior normal.  ? ? ?BP 122/82 (BP Location: Left Arm, Patient Position: Sitting, Cuff Size: Normal)   Pulse 64   Resp 16   Ht '5\' 3"'  (1.6 m)   Wt 223 lb 3.2 oz (101.2 kg)   SpO2 98%   BMI 39.54 kg/m?  ?Wt Readings from Last 3 Encounters:  ?12/15/21 223 lb 3.2 oz (101.2 kg)  ?09/13/21 244 lb 1.3 oz (110.7 kg)  ?11/26/20 242 lb (109.8 kg)  ? ? ?Lab Results  ?Component Value Date  ? TSH 1.960 09/23/2021  ? ?Lab Results  ?Component Value Date  ? WBC 6.4 09/23/2021  ? HGB 12.3 09/23/2021  ? HCT 36.7 09/23/2021  ? MCV 92 09/23/2021  ? PLT 294 09/23/2021  ? ?Lab Results  ?Component Value Date  ? NA 140 09/23/2021  ? K 4.4 09/23/2021  ? CO2 25 09/23/2021  ? GLUCOSE 81 09/23/2021  ? BUN 8 09/23/2021  ? CREATININE 0.86 09/23/2021  ? BILITOT 0.4 09/23/2021  ? ALKPHOS 98 09/23/2021  ? AST 15 09/23/2021  ? ALT 9 09/23/2021  ? PROT 6.6 09/23/2021  ? ALBUMIN 4.0 09/23/2021  ? CALCIUM 9.0 09/23/2021  ? ANIONGAP 11 09/04/2018  ? EGFR 80 09/23/2021  ? ?Lab Results  ?Component Value Date  ? CHOL 223 (H) 09/23/2021  ? ?Lab Results  ?Component Value Date  ? HDL 48 09/23/2021  ? ?Lab Results  ?Component Value  Date  ? LDLCALC 142 (H) 09/23/2021  ? ?Lab Results  ?Component Value Date  ? TRIG 181 (H) 09/23/2021  ? ?Lab Results  ?Component Value Date  ? CHOLHDL 4.6 (H) 09/23/2021  ? ?Lab Results  ?Component Value Date  ? HG

## 2021-12-15 NOTE — Assessment & Plan Note (Signed)
BP Readings from Last 1 Encounters:  ?12/15/21 122/82  ? ?Well-controlled ?On Atenolol, used to have palpitations in the past, now controlled ?Counseled for compliance with the medications ?Advised DASH diet and moderate exercise/walking, at least 150 mins/week ?

## 2021-12-15 NOTE — Assessment & Plan Note (Signed)
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

## 2021-12-16 LAB — LIPID PANEL
Chol/HDL Ratio: 3.6 ratio (ref 0.0–4.4)
Cholesterol, Total: 179 mg/dL (ref 100–199)
HDL: 50 mg/dL (ref >39)
LDL Chol Calc (NIH): 113 mg/dL — ABNORMAL HIGH (ref 0–99)
Triglycerides: 85 mg/dL (ref 0–149)
VLDL Cholesterol Cal: 16 mg/dL (ref 5–40)

## 2022-03-07 ENCOUNTER — Encounter: Payer: Self-pay | Admitting: Internal Medicine

## 2022-03-08 ENCOUNTER — Encounter: Payer: Self-pay | Admitting: *Deleted

## 2022-05-12 ENCOUNTER — Other Ambulatory Visit: Payer: Self-pay | Admitting: Obstetrics and Gynecology

## 2022-05-12 ENCOUNTER — Other Ambulatory Visit: Payer: Self-pay

## 2022-05-12 DIAGNOSIS — N6310 Unspecified lump in the right breast, unspecified quadrant: Secondary | ICD-10-CM

## 2022-06-10 ENCOUNTER — Other Ambulatory Visit: Payer: Self-pay | Admitting: Internal Medicine

## 2022-06-10 DIAGNOSIS — I10 Essential (primary) hypertension: Secondary | ICD-10-CM

## 2022-06-20 ENCOUNTER — Ambulatory Visit: Payer: Self-pay | Admitting: Internal Medicine

## 2022-06-22 ENCOUNTER — Other Ambulatory Visit: Payer: Self-pay

## 2022-07-05 ENCOUNTER — Ambulatory Visit: Payer: Self-pay | Admitting: Internal Medicine

## 2022-07-14 ENCOUNTER — Ambulatory Visit: Payer: Self-pay | Admitting: Internal Medicine

## 2022-07-14 ENCOUNTER — Encounter: Payer: Self-pay | Admitting: Internal Medicine

## 2022-09-08 ENCOUNTER — Other Ambulatory Visit: Payer: Self-pay | Admitting: Internal Medicine

## 2022-09-08 DIAGNOSIS — I10 Essential (primary) hypertension: Secondary | ICD-10-CM

## 2022-09-10 ENCOUNTER — Other Ambulatory Visit: Payer: Self-pay | Admitting: Internal Medicine

## 2022-09-10 DIAGNOSIS — I10 Essential (primary) hypertension: Secondary | ICD-10-CM

## 2022-09-14 ENCOUNTER — Other Ambulatory Visit: Payer: Self-pay | Admitting: Internal Medicine

## 2022-09-14 DIAGNOSIS — I10 Essential (primary) hypertension: Secondary | ICD-10-CM

## 2022-09-18 ENCOUNTER — Other Ambulatory Visit: Payer: Self-pay | Admitting: Internal Medicine

## 2022-09-18 DIAGNOSIS — I10 Essential (primary) hypertension: Secondary | ICD-10-CM

## 2023-03-29 IMAGING — US US BREAST*L* LIMITED INC AXILLA
1 series · 7 of 7 positions shown · non-contrast
Comparison: Previous exam(s).

CLINICAL DATA: Short-term follow-up for a probably benign left
breast mass. This was initially assessed on 11/26/2020. The patient
had a probable area of fat necrosis in the right breast at that
time, which evolved as expected for fat necrosis on a follow-up
study dated 02/26/2021 diagnosed as benign fat necrosis. The patient
now returns for six-month follow-up for the probably benign left
breast mass.

EXAM:
ULTRASOUND OF THE LEFT BREAST

[Series 1: us breast*left* limited inc axilla · 0.06mm/px · 7 of 7 slices shown]
[im 1/7]
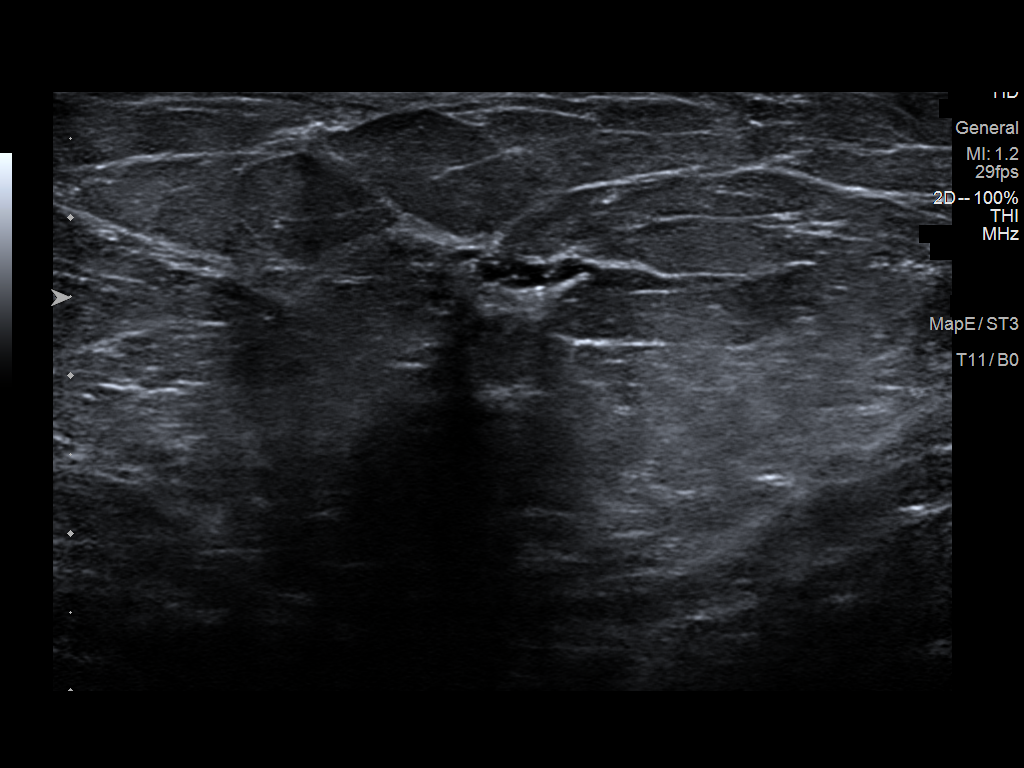
[im 2/7]
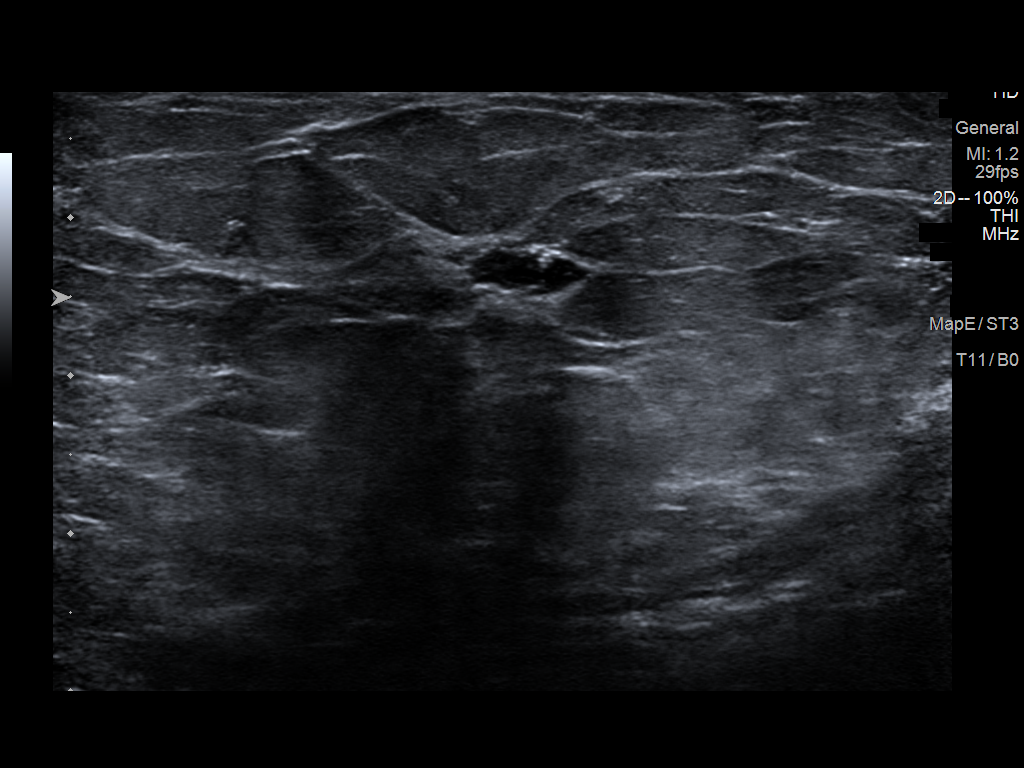
[im 3/7]
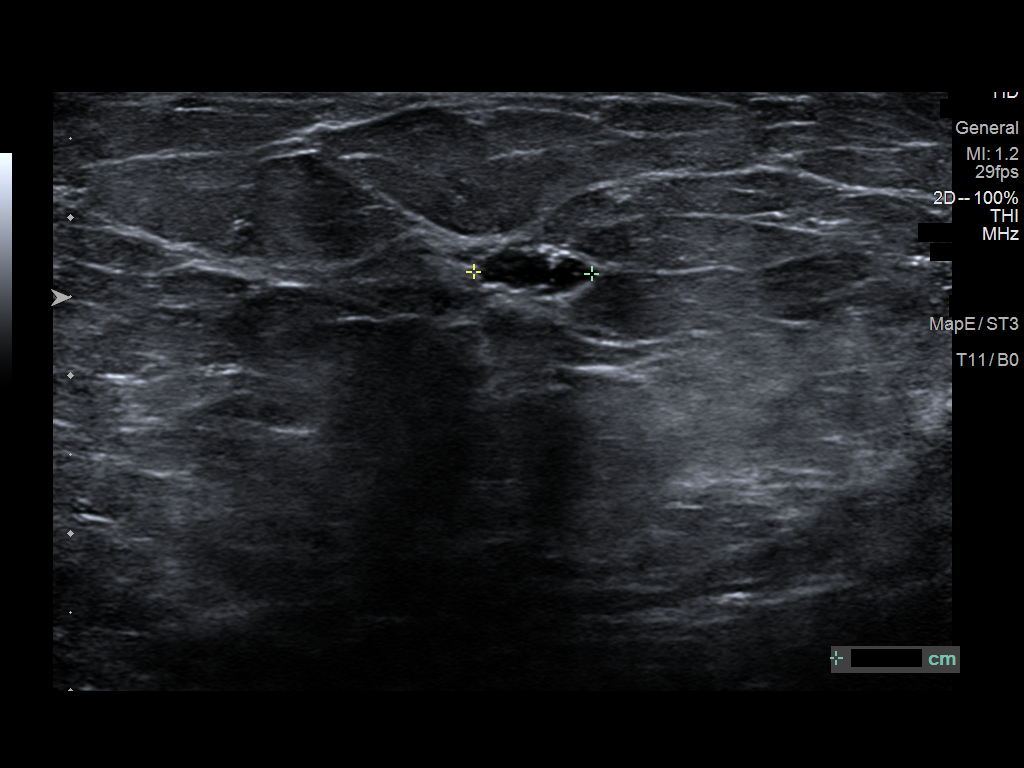
[im 4/7]
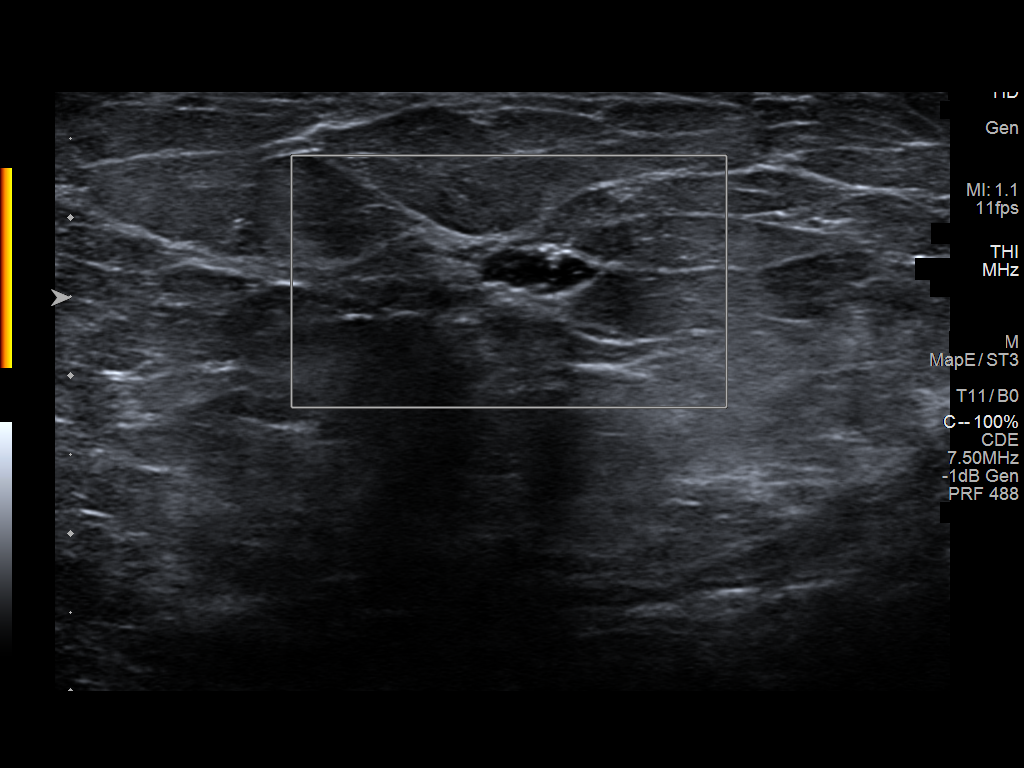
[im 5/7]
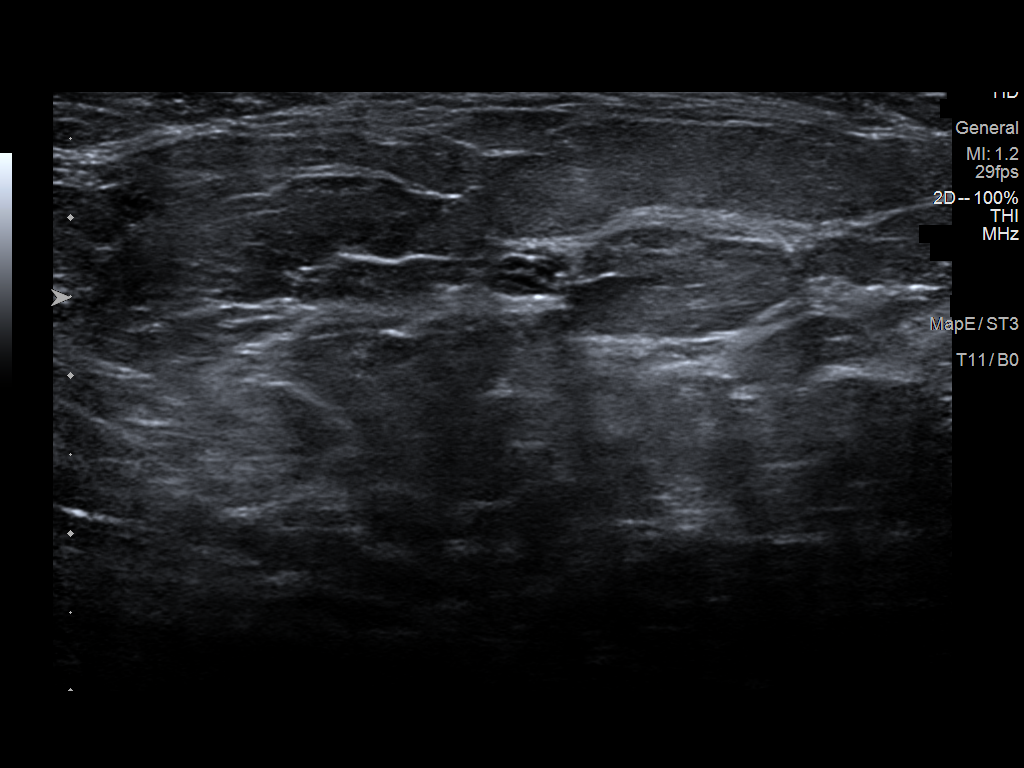
[im 6/7]
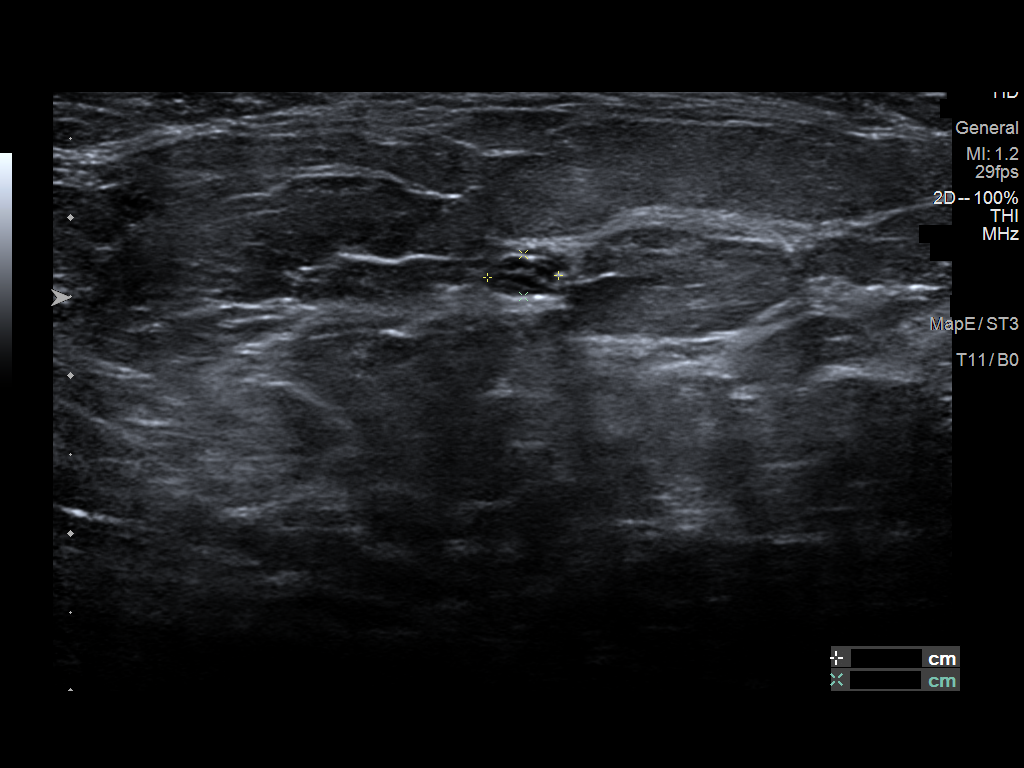
[im 7/7]
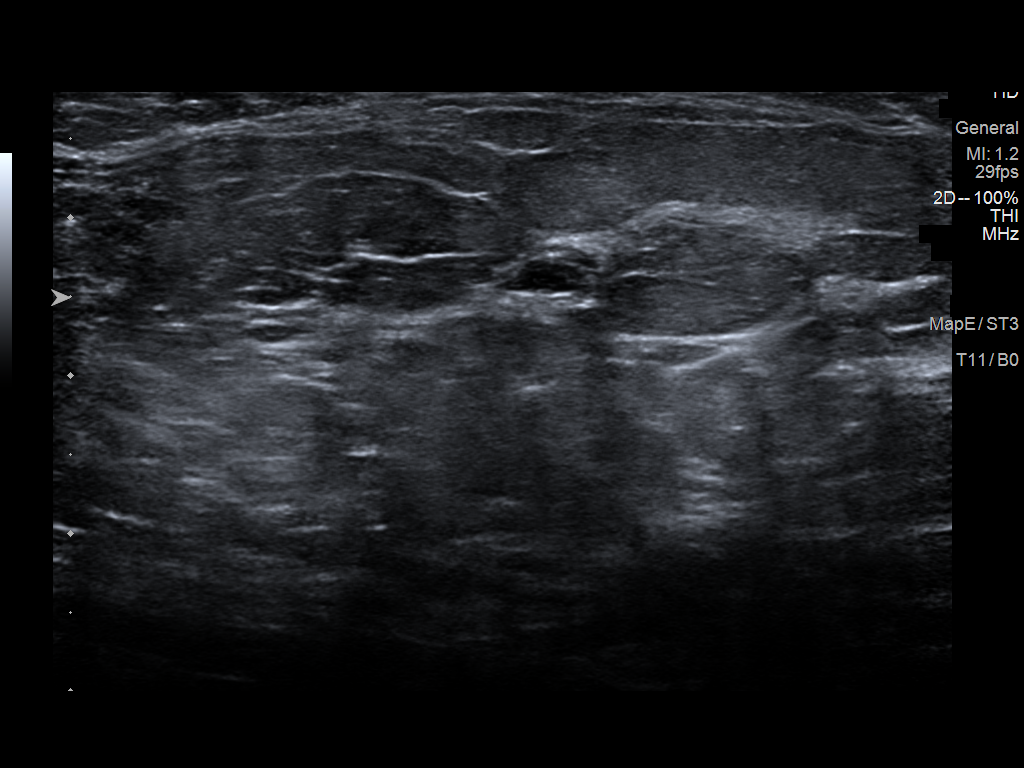

[7 of 7 positions shown; findings below may reference images not displayed]

FINDINGS: Targeted left breast ultrasound is performed, showing a small cystic
mass at 4 o'clock, 4 cm the nipple, measuring 8 x 3 x 5 mm,
consistent with a cluster of cysts/apocrine cyst, and without
significant change from the prior study.
IMPRESSION: 1. Probably benign small cystic mass in the left breast at 4
o'clock, stable for 6 months. Additional short-term follow-up
recommended.

RECOMMENDATION:
Diagnostic mammography and left breast ultrasound in 6 months.

I have discussed the findings and recommendations with the patient.
If applicable, a reminder letter will be sent to the patient
regarding the next appointment.

BI-RADS CATEGORY  3: Probably benign.

## 2023-09-12 ENCOUNTER — Encounter: Payer: Self-pay | Admitting: *Deleted

## 2023-09-12 ENCOUNTER — Telehealth: Payer: Self-pay | Admitting: *Deleted

## 2023-09-12 NOTE — Telephone Encounter (Signed)
Left VM for pt to call to discuss lung cancer screening. Pt may be candidate to be screening through the grant funding.

## 2023-12-26 ENCOUNTER — Encounter: Payer: Self-pay | Admitting: *Deleted
